# Patient Record
Sex: Female | Born: 1960 | Race: White | Hispanic: No | Smoking: Former smoker
Health system: Southern US, Community
[De-identification: ages and names within clinical notes are randomized; demographics above are authoritative.]

## PROBLEM LIST (undated history)

## (undated) DIAGNOSIS — M5410 Radiculopathy, site unspecified: Secondary | ICD-10-CM

## (undated) DIAGNOSIS — S83249A Other tear of medial meniscus, current injury, unspecified knee, initial encounter: Secondary | ICD-10-CM

## (undated) DIAGNOSIS — M47812 Spondylosis without myelopathy or radiculopathy, cervical region: Secondary | ICD-10-CM

## (undated) DIAGNOSIS — E785 Hyperlipidemia, unspecified: Secondary | ICD-10-CM

## (undated) DIAGNOSIS — E041 Nontoxic single thyroid nodule: Secondary | ICD-10-CM

## (undated) DIAGNOSIS — I2510 Atherosclerotic heart disease of native coronary artery without angina pectoris: Secondary | ICD-10-CM

## (undated) DIAGNOSIS — I10 Essential (primary) hypertension: Secondary | ICD-10-CM

## (undated) DIAGNOSIS — G5600 Carpal tunnel syndrome, unspecified upper limb: Secondary | ICD-10-CM

## (undated) HISTORY — DX: Other tear of medial meniscus, current injury, unspecified knee, initial encounter: S83.249A

## (undated) HISTORY — DX: Hyperlipidemia, unspecified: E78.5

## (undated) HISTORY — DX: Essential (primary) hypertension: I10

## (undated) HISTORY — DX: Radiculopathy, site unspecified: M54.10

## (undated) HISTORY — DX: Spondylosis without myelopathy or radiculopathy, cervical region: M47.812

## (undated) HISTORY — DX: Nontoxic single thyroid nodule: E04.1

## (undated) HISTORY — DX: Carpal tunnel syndrome, unspecified upper limb: G56.00

---
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
## 1996-06-19 HISTORY — PX: ABDOMINAL HYSTERECTOMY: SUR658

## 2010-10-09 ENCOUNTER — Other Ambulatory Visit: Payer: Self-pay | Admitting: Family Medicine

## 2010-10-09 DIAGNOSIS — Z1231 Encounter for screening mammogram for malignant neoplasm of breast: Secondary | ICD-10-CM

## 2010-10-20 ENCOUNTER — Ambulatory Visit
Admission: RE | Admit: 2010-10-20 | Discharge: 2010-10-20 | Disposition: A | Discharge status: Home / Self Care | Payer: PRIVATE HEALTH INSURANCE | Source: Ambulatory Visit | Attending: Family Medicine | Admitting: Family Medicine

## 2010-10-20 DIAGNOSIS — Z1231 Encounter for screening mammogram for malignant neoplasm of breast: Secondary | ICD-10-CM

## 2011-01-20 HISTORY — PX: CARPAL TUNNEL RELEASE: SHX101

## 2013-02-10 DIAGNOSIS — I781 Nevus, non-neoplastic: Secondary | ICD-10-CM | POA: Insufficient documentation

## 2013-02-10 DIAGNOSIS — D229 Melanocytic nevi, unspecified: Secondary | ICD-10-CM | POA: Insufficient documentation

## 2016-01-20 HISTORY — PX: MENISCUS REPAIR: SHX5179

## 2016-03-02 DIAGNOSIS — Z9889 Other specified postprocedural states: Secondary | ICD-10-CM | POA: Insufficient documentation

## 2017-01-07 DIAGNOSIS — S4992XA Unspecified injury of left shoulder and upper arm, initial encounter: Secondary | ICD-10-CM | POA: Insufficient documentation

## 2017-01-19 HISTORY — PX: ROTATOR CUFF REPAIR: SHX139

## 2017-03-15 DIAGNOSIS — M47812 Spondylosis without myelopathy or radiculopathy, cervical region: Secondary | ICD-10-CM | POA: Insufficient documentation

## 2017-04-19 HISTORY — PX: ANTERIOR CERVICAL DECOMP/DISCECTOMY FUSION: SHX1161

## 2017-04-21 DIAGNOSIS — G959 Disease of spinal cord, unspecified: Secondary | ICD-10-CM | POA: Insufficient documentation

## 2017-04-21 DIAGNOSIS — M4802 Spinal stenosis, cervical region: Secondary | ICD-10-CM | POA: Insufficient documentation

## 2017-04-21 DIAGNOSIS — M542 Cervicalgia: Secondary | ICD-10-CM | POA: Insufficient documentation

## 2017-05-18 DIAGNOSIS — Z9889 Other specified postprocedural states: Secondary | ICD-10-CM | POA: Insufficient documentation

## 2017-09-08 DIAGNOSIS — S43432A Superior glenoid labrum lesion of left shoulder, initial encounter: Secondary | ICD-10-CM | POA: Insufficient documentation

## 2017-12-03 DIAGNOSIS — M75102 Unspecified rotator cuff tear or rupture of left shoulder, not specified as traumatic: Secondary | ICD-10-CM | POA: Insufficient documentation

## 2019-06-20 HISTORY — PX: OTHER SURGICAL HISTORY: SHX169

## 2019-07-20 HISTORY — PX: CERVICAL ABLATION: SHX5771

## 2019-08-23 ENCOUNTER — Other Ambulatory Visit: Payer: Self-pay | Admitting: Family Medicine

## 2019-08-23 DIAGNOSIS — I739 Peripheral vascular disease, unspecified: Secondary | ICD-10-CM

## 2019-08-29 ENCOUNTER — Other Ambulatory Visit: Payer: PRIVATE HEALTH INSURANCE

## 2019-09-05 ENCOUNTER — Ambulatory Visit
Admission: RE | Admit: 2019-09-05 | Discharge: 2019-09-05 | Disposition: A | Discharge status: Home / Self Care | Payer: PRIVATE HEALTH INSURANCE | Source: Ambulatory Visit | Attending: Family Medicine | Admitting: Family Medicine

## 2019-09-05 ENCOUNTER — Other Ambulatory Visit: Payer: Self-pay

## 2019-09-05 DIAGNOSIS — I739 Peripheral vascular disease, unspecified: Secondary | ICD-10-CM

## 2019-09-12 ENCOUNTER — Other Ambulatory Visit: Payer: Self-pay | Admitting: Family Medicine

## 2019-09-12 DIAGNOSIS — E0789 Other specified disorders of thyroid: Secondary | ICD-10-CM

## 2019-09-18 ENCOUNTER — Ambulatory Visit
Admission: RE | Admit: 2019-09-18 | Discharge: 2019-09-18 | Disposition: A | Discharge status: Home / Self Care | Payer: PRIVATE HEALTH INSURANCE | Source: Ambulatory Visit | Attending: Family Medicine | Admitting: Family Medicine

## 2019-09-18 DIAGNOSIS — E0789 Other specified disorders of thyroid: Secondary | ICD-10-CM

## 2019-12-08 ENCOUNTER — Encounter: Payer: Self-pay | Admitting: *Deleted

## 2019-12-08 ENCOUNTER — Other Ambulatory Visit: Payer: Self-pay | Admitting: *Deleted

## 2019-12-11 ENCOUNTER — Encounter: Payer: Self-pay | Admitting: Diagnostic Neuroimaging

## 2019-12-11 ENCOUNTER — Ambulatory Visit: Payer: PRIVATE HEALTH INSURANCE | Admitting: Diagnostic Neuroimaging

## 2019-12-11 ENCOUNTER — Telehealth: Payer: Self-pay | Admitting: Diagnostic Neuroimaging

## 2019-12-11 ENCOUNTER — Other Ambulatory Visit: Payer: Self-pay

## 2019-12-11 VITALS — BP 109/73 | HR 80 | Ht 63.0 in | Wt 178.0 lb

## 2019-12-11 DIAGNOSIS — H538 Other visual disturbances: Secondary | ICD-10-CM

## 2019-12-11 DIAGNOSIS — R519 Headache, unspecified: Secondary | ICD-10-CM

## 2019-12-11 DIAGNOSIS — G44099 Other trigeminal autonomic cephalgias (TAC), not intractable: Secondary | ICD-10-CM

## 2019-12-11 MED ORDER — TOPIRAMATE 100 MG PO TABS
100.0000 mg | ORAL_TABLET | Freq: Two times a day (BID) | ORAL | 4 refills | Status: DC
Start: 2019-12-11 — End: 2020-09-10

## 2019-12-11 NOTE — Progress Notes (Signed)
[facility]  PATIENT: Renee Scott DOB: 08/21/1960  REFERRING CLINICIAN: Fanny Bien, MD HISTORY FROM: patient  REASON FOR VISIT: new consult    HISTORICAL  CHIEF COMPLAINT:  Chief Complaint  Patient presents with  . Tingling in hands, feet    rm 7 New Pt "HA behind L eye, causes blurred vision; neck discomfort, hands tingle off anf on"    HISTORY OF PRESENT ILLNESS:   59 year old female here for evaluation of headaches.  Patient has complex surgical history including C4-C6 ACDF in 2019, cervical nerve root ablation in July 2021, history of carpal tunnel release surgery, right knee meniscus tear surgery, rotator cuff repair.   Following cervical ablation surgery in July 2021 patient experienced increasing pain in the left posterior neck, left occipital region.  In September 2021 patient had increasing left-sided headaches, left retro-orbital pain, photophobia, blurred vision, scalp sensitivity.  Patient went to ophthalmology clinic was evaluated for temporal arteritis or other primary ocular pathologies which were ruled out.  No prior history of migraine.  Has family history of migraine in a cousin.  Headaches last 20 to 25 minutes at a time, multiple times a day.  She takes Aleve and Motrin multiple times on a daily basis.  No other specific triggering or aggravating factors.   REVIEW OF SYSTEMS: Full 14 system review of systems performed and negative with exception of: As per HPI.  ALLERGIES: Allergies  Allergen Reactions  . Ketamine Other (See Comments)    "I hallucinated terribly"    HOME MEDICATIONS: Outpatient Medications Prior to Visit  Medication Sig Dispense Refill  . Cholecalciferol (VITAMIN D3 PO) Take 5,000 Units by mouth daily.    Marland Kitchen ibuprofen (ADVIL) 800 MG tablet Take 800 mg by mouth every 8 (eight) hours as needed.    . rosuvastatin (CRESTOR) 5 MG tablet Take 1 tablet by mouth every night for high cholesterol    .  topiramate (TOPAMAX) 50 MG tablet Take 50 mg by mouth 3 (three) times daily.    . valsartan-hydrochlorothiazide (DIOVAN-HCT) 80-12.5 MG tablet Take 1 tablet by mouth daily.    . calcium carbonate (TUMS EX) 750 MG chewable tablet Chew by mouth. (Patient not taking: Reported on 12/11/2019)    . gabapentin (NEURONTIN) 100 MG capsule Take by mouth. (Patient not taking: Reported on 12/11/2019)    . lidocaine (XYLOCAINE) 5 % ointment Apply topically 3 (three) times daily as needed. (Patient not taking: Reported on 12/11/2019)     No facility-administered medications prior to visit.    PAST MEDICAL HISTORY: Past Medical History:  Diagnosis Date  . Carpal tunnel syndrome   . Cervical arthritis   . Hyperlipidemia   . Hypertension   . Medial meniscus tear    right  . Radiculopathy   . Spondylosis of cervical spine   . Thyroid nodule     PAST SURGICAL HISTORY: Past Surgical History:  Procedure Laterality Date  . ABDOMINAL HYSTERECTOMY  06/1996  . ANTERIOR CERVICAL DECOMP/DISCECTOMY FUSION  04/2017  . CARPAL TUNNEL RELEASE Right 2013  . CERVICAL ABLATION Left 07/2019  . left neck cervical ablation  06/2019  . MENISCUS REPAIR Right 2018  . ROTATOR CUFF REPAIR Left 2019    FAMILY HISTORY: Family History  Problem Relation Age of Onset  . Hypertension Father   . High Cholesterol Father   . Hypertension Paternal Grandmother   . High Cholesterol Paternal Grandmother   . Thyroid disease Paternal Grandmother   . Diabetes Paternal Grandmother  SOCIAL HISTORY: Social History   Socioeconomic History  . Marital status: Widowed    Spouse name: Not on file  . Number of children: 3  . Years of education: Not on file  . Highest education level: Associate degree: academic program  Occupational History    Comment: RN  Tobacco Use  . Smoking status: Former Smoker    Quit date: 06/09/2016    Years since quitting: 3.5  . Smokeless tobacco: Never Used  Substance and Sexual Activity  .  Alcohol use: Not Currently  . Drug use: Never  . Sexual activity: Not on file  Other Topics Concern  . Not on file  Social History Narrative   Lives alone   Caffeine- 3-5 c daily   Social Determinants of Health   Financial Resource Strain:   . Difficulty of Paying Living Expenses: Not on file  Food Insecurity:   . Worried About Charity fundraiser in the Last Year: Not on file  . Ran Out of Food in the Last Year: Not on file  Transportation Needs:   . Lack of Transportation (Medical): Not on file  . Lack of Transportation (Non-Medical): Not on file  Physical Activity:   . Days of Exercise per Week: Not on file  . Minutes of Exercise per Session: Not on file  Stress:   . Feeling of Stress : Not on file  Social Connections:   . Frequency of Communication with Friends and Family: Not on file  . Frequency of Social Gatherings with Friends and Family: Not on file  . Attends Religious Services: Not on file  . Active Member of Clubs or Organizations: Not on file  . Attends Archivist Meetings: Not on file  . Marital Status: Not on file  Intimate Partner Violence:   . Fear of Current or Ex-Partner: Not on file  . Emotionally Abused: Not on file  . Physically Abused: Not on file  . Sexually Abused: Not on file     PHYSICAL EXAM  GENERAL EXAM/CONSTITUTIONAL: Vitals:  Vitals:   12/11/19 0944  BP: 109/73  Pulse: 80  Weight: 178 lb (80.7 kg)  Height: '5\' 3"'  (1.6 m)     Body mass index is 31.53 kg/m. Wt Readings from Last 3 Encounters:  12/11/19 178 lb (80.7 kg)     Patient is in no distress; well developed, nourished and groomed; neck is supple  CARDIOVASCULAR:  Examination of carotid arteries is normal; no carotid bruits  Regular rate and rhythm, no murmurs  Examination of peripheral vascular system by observation and palpation is normal  EYES:  Ophthalmoscopic exam of optic discs and posterior segments is normal; no papilledema or hemorrhages  No  exam data present  MUSCULOSKELETAL:  Gait, strength, tone, movements noted in Neurologic exam below  NEUROLOGIC: MENTAL STATUS:  No flowsheet data found.  awake, alert, oriented to person, place and time  recent and remote memory intact  normal attention and concentration  language fluent, comprehension intact, naming intact  fund of knowledge appropriate  CRANIAL NERVE:   2nd - no papilledema on fundoscopic exam  2nd, 3rd, 4th, 6th - pupils equal and reactive to light, visual fields full to confrontation, extraocular muscles intact, no nystagmus  5th - facial sensation symmetric  7th - facial strength symmetric  8th - hearing intact  9th - palate elevates symmetrically, uvula midline  11th - shoulder shrug symmetric  12th - tongue protrusion midline  MOTOR:   normal bulk and tone, full  strength in the BUE, BLE  SENSORY:   normal and symmetric to light touch, temperature, vibration  COORDINATION:   finger-nose-finger, fine finger movements normal  REFLEXES:   deep tendon reflexes present and symmetric  GAIT/STATION:   narrow based gait     DIAGNOSTIC DATA (LABS, IMAGING, TESTING) - I reviewed patient records, labs, notes, testing and imaging myself where available.  No results found for: WBC, HGB, HCT, MCV, PLT No results found for: NA, K, CL, CO2, GLUCOSE, BUN, CREATININE, CALCIUM, PROT, ALBUMIN, AST, ALT, ALKPHOS, BILITOT, GFRNONAA, GFRAA No results found for: CHOL, HDL, LDLCALC, LDLDIRECT, TRIG, CHOLHDL No results found for: HGBA1C No results found for: VITAMINB12 No results found for: TSH   09/05/19 carotid u/s: - Minor carotid atherosclerosis. No hemodynamically significant ICA stenosis. Degree of narrowing less than 50% bilaterally by ultrasound criteria. - Patent antegrade vertebral flow bilaterally - Calcified right thyroid nodules.  See above recommendation.  04/09/17 MRI cervical spine - Multilevel degenerative changes of the  cervical spine most advanced at C4-C5 where there is near circumferential effacement of CSF, mass effect on the ventral spinal cord and severe bilateral foraminal stenosis. There is also bilateral foraminal stenosis at C5-C6 severe on the right and moderate on the left at C5-C6 and moderate bilateral foraminal stenosis at C6-C7.  12/07/19 ESR 20, CRP 8.3    ASSESSMENT AND PLAN  59 y.o. 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  Cluster headache or migraine are in differential.  Other secondary causes also possible.  We will proceed with further work-up and try additional headache treatments.   Dx:  1. Trigeminal autonomic cephalgias      Meds tried: gabapentin, topiramate   PLAN:  LEFT SIDED HEADACHES (? TRIGEMINAL AUTONOMIC CEPHALGIA --> SUNCT vs cluster headache vs migraine) - check MRI brain (rule out secondary causes)  HEADACHE PREVENTION  LIFESTYLE CHANGES -Stop or avoid smoking -Decrease or avoid caffeine / alcohol -Eat and sleep on a regular schedule -Exercise several times per week - increase topiramate to 1106m twice a day  - may consider trileptal or lamotrigine in future - may consider galazanezumab (Emgality) 243mloading dose; then 12073monthly in future  HEADACHE RESCUE - ibuprofen, tylenol as needed; limit to 5-10 times per month - consider rizatriptan (Maxalt) 25m43m needed for breakthrough headache; may repeat x 1 after 2 hours; max 2 tabs per day or 8 per month  Orders Placed This Encounter  Procedures  . MR BRAIN W WO CONTRAST   Meds ordered this encounter  Medications  . topiramate (TOPAMAX) 100 MG tablet    Sig: Take 1 tablet (100 mg total) by mouth 2 (two) times daily.    Dispense:  180 tablet    Refill:  4   Return in about 6 months (around 06/09/2020) for with NP (Amy Lomax).    VIKRPenni Bombard  11/243/[id number] in Neurology, Neurophysiology and Neuroimaging  [facility] [address] [phone]

## 2019-12-11 NOTE — Telephone Encounter (Signed)
[employer] order sent to GI. They will obtain the auth and reach out to the patient to schedule.  

## 2019-12-11 NOTE — Patient Instructions (Signed)
  LEFT SIDED HEADACHES (? TRIGEMINAL AUTONOMIC CEPHALGIA --> SUNCT vs cluster headache vs migraine) - check MRI brain (rule out secondary causes)  MIGRAINE PREVENTION  LIFESTYLE CHANGES -Stop or avoid smoking -Decrease or avoid caffeine / alcohol -Eat and sleep on a regular schedule -Exercise several times per week - increase topiramate to 100mg  twice a day  - may consider trileptal or lamotrigine in future - may consider galazanezumab (Emgality) 240mg  loading dose; then 120mg  monthly in future  MIGRAINE RESCUE - ibuprofen, tylenol as needed; limit to 5-10 times per month - consider rizatriptan (Maxalt) 10mg  as needed for breakthrough headache; may repeat x 1 after 2 hours; max 2 tabs per day or 8 per month

## 2019-12-12 ENCOUNTER — Other Ambulatory Visit: Payer: Self-pay | Admitting: Diagnostic Neuroimaging

## 2019-12-22 ENCOUNTER — Other Ambulatory Visit: Payer: Self-pay | Admitting: Family Medicine

## 2019-12-22 ENCOUNTER — Other Ambulatory Visit: Payer: Self-pay

## 2019-12-22 ENCOUNTER — Ambulatory Visit
Admission: RE | Admit: 2019-12-22 | Discharge: 2019-12-22 | Disposition: A | Discharge status: Home / Self Care | Payer: PRIVATE HEALTH INSURANCE | Source: Ambulatory Visit | Attending: Diagnostic Neuroimaging | Admitting: Diagnostic Neuroimaging

## 2019-12-22 DIAGNOSIS — H538 Other visual disturbances: Secondary | ICD-10-CM

## 2019-12-22 DIAGNOSIS — Z1231 Encounter for screening mammogram for malignant neoplasm of breast: Secondary | ICD-10-CM

## 2019-12-22 DIAGNOSIS — R519 Headache, unspecified: Secondary | ICD-10-CM

## 2019-12-22 MED ORDER — GADOBENATE DIMEGLUMINE 529 MG/ML IV SOLN
15.0000 mL | Freq: Once | INTRAVENOUS | Status: AC | PRN
  Administered 2019-12-22: 15 mL via INTRAVENOUS

## 2020-01-22 ENCOUNTER — Encounter: Payer: Self-pay | Admitting: *Deleted

## 2020-02-02 ENCOUNTER — Ambulatory Visit: Payer: PRIVATE HEALTH INSURANCE

## 2020-03-12 ENCOUNTER — Ambulatory Visit: Payer: PRIVATE HEALTH INSURANCE

## 2020-03-13 ENCOUNTER — Ambulatory Visit: Payer: PRIVATE HEALTH INSURANCE

## 2020-04-29 ENCOUNTER — Ambulatory Visit: Payer: PRIVATE HEALTH INSURANCE

## 2020-06-10 NOTE — Progress Notes (Signed)
Chief Complaint  Patient presents with  . 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      HISTORY OF PRESENT ILLNESS: 06/11/20 ALL:  Renee Scott is a 60 y.o. female here today for follow up for left sided headaches. MRI was unremarkable. Topiramate was increased to 100mg  BID. Since, headaches are less frequent. She reports that headaches are fairly severe when she does get them. She has left sided pounding, throbbing headaches. Occurring daily. Can last minutes to hours. She has light and sound sensitivity. She describes having wavy vision in left eye prior to headache. She has tried Aleve and Motrin with minimal relief. She has cut back and no longer taking daily. She does not smoke.    HISTORY (copied from Dr 58 previous note)  60 year old female here for evaluation of headaches.  Patient has complex surgical history including C4-C6 ACDF in 2019, cervical nerve root ablation in July 2021, history of carpal tunnel release surgery, right knee meniscus tear surgery, rotator cuff repair.   Following cervical ablation surgery in July 2021 patient experienced increasing pain in the left posterior neck, left occipital region.  In September 2021 patient had increasing left-sided headaches, left retro-orbital pain, photophobia, blurred vision, scalp sensitivity.  Patient went to ophthalmology clinic was evaluated for temporal arteritis or other primary ocular pathologies which were ruled out.  No prior history of migraine.  Has family history of migraine in a cousin.  Headaches last 20 to 25 minutes at a time, multiple times a day.  She takes Aleve and Motrin multiple times on a daily basis.  No other specific triggering or aggravating factors.    REVIEW OF SYSTEMS: Out of a complete 14 system review of symptoms, the  patient complains only of the following symptoms, headaches and all other reviewed systems are negative.    ALLERGIES: Allergies  Allergen Reactions  . Ketamine Other (See Comments)    "I hallucinated terribly"     HOME MEDICATIONS: Outpatient Medications Prior to Visit  Medication Sig Dispense Refill  . calcium carbonate (TUMS EX) 750 MG chewable tablet Chew by mouth.    . Cholecalciferol (VITAMIN D3 PO) Take 5,000 Units by mouth daily.    August 2021 ibuprofen (ADVIL) 800 MG tablet Take 800 mg by mouth every 8 (eight) hours as needed.    . rosuvastatin (CRESTOR) 10 MG tablet Take 10 mg by mouth at bedtime.    . topiramate (TOPAMAX) 100 MG tablet Take 1 tablet (100 mg total) by mouth 2 (two) times daily. 180 tablet 4  . valsartan-hydrochlorothiazide (DIOVAN-HCT) 160-25 MG tablet Take 1 tablet by mouth daily.    October 2021 gabapentin (NEURONTIN) 100 MG capsule Take by mouth. (Patient not taking: Reported on 12/11/2019)    . lidocaine (XYLOCAINE) 5 % ointment Apply topically 3 (three) times daily as needed. (Patient not taking: Reported on 12/11/2019)    . rosuvastatin (CRESTOR) 5 MG tablet Take 1 tablet by mouth every night for high cholesterol     No facility-administered medications prior to visit.     PAST MEDICAL HISTORY: Past Medical History:  Diagnosis Date  . Carpal tunnel syndrome   . Cervical arthritis   . Hyperlipidemia   . Hypertension   . Medial meniscus tear    right  . Radiculopathy   . Spondylosis  of cervical spine   . Thyroid nodule      PAST SURGICAL HISTORY: Past Surgical History:  Procedure Laterality Date  . ABDOMINAL HYSTERECTOMY  06/1996  . ANTERIOR CERVICAL DECOMP/DISCECTOMY FUSION  04/2017  . CARPAL TUNNEL RELEASE Right 2013  . CERVICAL ABLATION Left 07/2019  . left neck cervical ablation  06/2019  . MENISCUS REPAIR Right 2018  . ROTATOR CUFF REPAIR Left 2019     FAMILY HISTORY: Family History  Problem Relation Age of Onset  . Hypertension Father   .  High Cholesterol Father   . Hypertension Paternal Grandmother   . High Cholesterol Paternal Grandmother   . Thyroid disease Paternal Grandmother   . Diabetes Paternal Grandmother      SOCIAL HISTORY: Social History   Socioeconomic History  . Marital status: Widowed    Spouse name: Not on file  . Number of children: 3  . Years of education: Not on file  . Highest education level: Associate degree: academic program  Occupational History    Comment: RN  Tobacco Use  . Smoking status: Former Smoker    Quit date: 06/09/2016    Years since quitting: 4.0  . Smokeless tobacco: Never Used  Substance and Sexual Activity  . Alcohol use: Not Currently  . Drug use: Never  . Sexual activity: Not on file  Other Topics Concern  . Not on file  Social History Narrative   Lives alone   Caffeine- 3-5 c daily   Social Determinants of Health   Financial Resource Strain: Not on file  Food Insecurity: Not on file  Transportation Needs: Not on file  Physical Activity: Not on file  Stress: Not on file  Social Connections: Not on file  Intimate Partner Violence: Not on file      PHYSICAL EXAM  Vitals:   06/11/20 0944  BP: 92/63  Pulse: (!) 109  Weight: 170 lb (77.1 kg)  Height: 5\' 3"  (1.6 m)   Body mass index is 30.11 kg/m.   Generalized: Well developed, in no acute distress  Cardiology: normal rate and rhythm, no murmur auscultated  Respiratory: clear to auscultation bilaterally    Neurological examination  Mentation: Alert oriented to time, place, history taking. Follows all commands speech and language fluent Cranial nerve II-XII: Pupils were equal round reactive to light. Extraocular movements were full, visual field were full on confrontational test. Facial sensation and strength were normal. Uvula tongue midline. Head turning and shoulder shrug  were normal and symmetric. Motor: The motor testing reveals 5 over 5 strength of all 4 extremities. Good symmetric motor tone  is noted throughout.  Sensory: Sensory testing is intact to soft touch on all 4 extremities. No evidence of extinction is noted.  Coordination: Cerebellar testing reveals good finger-nose-finger and heel-to-shin bilaterally.  Gait and station: Gait is normal. Tandem gait is normal. Romberg is negative. No drift is seen.  Reflexes: Deep tendon reflexes are symmetric and normal bilaterally.     DIAGNOSTIC DATA (LABS, IMAGING, TESTING) - I reviewed patient records, labs, notes, testing and imaging myself where available.  No results found for: WBC, HGB, HCT, MCV, PLT No results found for: NA, K, CL, CO2, GLUCOSE, BUN, CREATININE, CALCIUM, PROT, ALBUMIN, AST, ALT, ALKPHOS, BILITOT, GFRNONAA, GFRAA No results found for: CHOL, HDL, LDLCALC, LDLDIRECT, TRIG, CHOLHDL No results found for: No results found for: VITAMINB12 No results found for: TSH  No flowsheet data found.   No flowsheet data found.   ASSESSMENT AND PLAN  60 y.o. year old female  has a past medical history of Carpal tunnel syndrome, Cervical arthritis, Hyperlipidemia, Hypertension, Medial meniscus tear, Radiculopathy, Spondylosis of cervical spine, and Thyroid nodule. here with    Chronic migraine with aura - Plan: Galcanezumab-gnlm (EMGALITY) 120 MG/ML SOAJ, rizatriptan (MAXALT-MLT) 10 MG disintegrating tablet  We will continue topiramate 100mg  twice daily. I will add Emgality. She will administer 2 injections for starter dose then continue 1 injection every 30 days. We will start rizatriptan as needed for abortive therapy. Correct administration, side effects and storage of each were discussed. She will continue healthy lifestyle habits. Continue follow up with spine specialist and PCP. Follow up with me in 3 months.    No orders of the defined types were placed in this encounter.    Meds ordered this encounter  Medications  . Galcanezumab-gnlm (EMGALITY) 120 MG/ML SOAJ    Sig: Inject 120 mg into the skin  every 30 (thirty) days.    Dispense:  3 mL    Refill:  3    Order Specific Question:   Supervising Provider    Answer:   Anson Fret  . rizatriptan (MAXALT-MLT) 10 MG disintegrating tablet    Sig: Take 1 tablet (10 mg total) by mouth as needed for migraine. May repeat in 2 hours if needed    Dispense:  9 tablet    Refill:  11    Order Specific Question:   Supervising Provider    Answer:   [id number] Anson Fret  . Galcanezumab-gnlm (EMGALITY) 120 MG/ML SOAJ    Sig: Administer 2 injections for starter dose, continue 1 injection every 30 days following    Dispense:  2 mL    Refill:  0    Order Specific Question:   Supervising Provider    Answer:   [id number] Anson Fret, MSN, FNP-C 06/11/2020, 10:24 AM  [facility] [address] [place], [place] [phone]

## 2020-06-11 ENCOUNTER — Ambulatory Visit: Payer: PRIVATE HEALTH INSURANCE | Admitting: Family Medicine

## 2020-06-11 ENCOUNTER — Encounter: Payer: Self-pay | Admitting: Family Medicine

## 2020-06-11 VITALS — BP 92/63 | HR 109 | Ht 63.0 in | Wt 170.0 lb

## 2020-06-11 DIAGNOSIS — G43109 Migraine with aura, not intractable, without status migrainosus: Secondary | ICD-10-CM

## 2020-06-11 DIAGNOSIS — R519 Headache, unspecified: Secondary | ICD-10-CM

## 2020-06-11 MED ORDER — EMGALITY 120 MG/ML ~~LOC~~ SOAJ: SUBCUTANEOUS | 0 refills | Status: DC

## 2020-06-11 MED ORDER — RIZATRIPTAN BENZOATE 10 MG PO TBDP: 10.0000 mg | ORAL_TABLET | ORAL | 11 refills | Status: DC | PRN

## 2020-06-11 MED ORDER — EMGALITY 120 MG/ML ~~LOC~~ SOAJ: 120.0000 mg | SUBCUTANEOUS | 3 refills | Status: DC

## 2020-06-11 NOTE — Patient Instructions (Addendum)
Below is our plan:  We will continue topiramate  twice daily. I will add Emgality injections every 30 days. You will administer two injections for the first dose and then continue 1 injection every 30 days following. I will also start rizatriptan for abortive therapy. Please take 1 tablet at onset of headache. May take 1 additional tablet in 2 hours if needed. Do not take more than 2 tablets in 24 hours or more than 10 in a month.    Please make sure you are staying well hydrated. I recommend 50-60 ounces daily. Well balanced diet and regular exercise encouraged. Consistent sleep schedule with 6-8 hours recommended.   Please continue follow up with care team as directed.   Follow up with me in 3 months   You may receive a survey regarding today's visit. I encourage you to leave honest feed back as I do use this information to improve patient care. Thank you for seeing me today!    Galcanezumab injection What is this medicine? GALCANEZUMAB (gal ka NEZ ue mab) is used to prevent migraines and treat cluster headaches. This medicine may be used for other purposes; ask your health care provider or pharmacist if you have questions. COMMON BRAND NAME(S): Emgality What should I tell my health care provider before I take this medicine? They need to know if you have any of these conditions:  an unusual or allergic reaction to galcanezumab, other medicines, foods, dyes, or preservatives  pregnant or trying to get pregnant  breast-feeding How should I use this medicine? This medicine is for injection under the skin. You will be taught how to prepare and give this medicine. Use exactly as directed. Take your medicine at regular intervals. Do not take your medicine more often than directed. It is important that you put your used needles and syringes in a special sharps container. Do not put them in a trash can. If you do not have a sharps container, call your pharmacist or healthcare provider  to get one. Talk to your pediatrician regarding the use of this medicine in children. Special care may be needed. Overdosage: If you think you have taken too much of this medicine contact a poison control center or emergency room at once. NOTE: This medicine is only for you. Do not share this medicine with others. What if I miss a dose? If you miss a dose, take it as soon as you can. If it is almost time for your next dose, take only that dose. Do not take double or extra doses. What may interact with this medicine? Interactions are not expected. This list may not describe all possible interactions. Give your health care provider a list of all the medicines, herbs, non-prescription drugs, or dietary supplements you use. Also tell them if you smoke, drink alcohol, or use illegal drugs. Some items may interact with your medicine. What should I watch for while using this medicine? Tell your doctor or healthcare professional if your symptoms do not start to get better or if they get worse. What side effects may I notice from receiving this medicine? Side effects that you should report to your doctor or health care professional as soon as possible:  allergic reactions like skin rash, itching or hives, swelling of the face, lips, or tongue Side effects that usually do not require medical attention (report these to your doctor or health care professional if they continue or are bothersome):  pain, redness, or irritation at site where injected This list may  not describe all possible side effects. Call your doctor for medical advice about side effects. You may report side effects to FDA at 1-800-FDA-1088. Where should I keep my medicine? Keep out of the reach of children. You will be instructed on how to store this medicine. Throw away any unused medicine after the expiration date on the label. NOTE: This sheet is a summary. It may not cover all possible information. If you have questions about this  medicine, talk to your doctor, pharmacist, or health care provider.  2021 Elsevier/Gold Standard (2017-06-23 12:03:23)   Rizatriptan disintegrating tablets What is this medicine? RIZATRIPTAN (rye za TRIP tan) is used to treat migraines with or without aura. An aura is a strange feeling or visual disturbance that warns you of an attack. It is not used to prevent migraines. This medicine may be used for other purposes; ask your health care provider or pharmacist if you have questions. COMMON BRAND NAME(S): Maxalt-MLT What should I tell my health care provider before I take this medicine? They need to know if you have any of these conditions:  cigarette smoker  circulation problems in fingers and toes  diabetes  heart disease  high blood pressure  high cholesterol  history of irregular heartbeat  history of stroke  kidney disease  liver disease  stomach or intestine problems  an unusual or allergic reaction to rizatriptan, other medicines, foods, dyes, or preservatives  pregnant or trying to get pregnant  breast-feeding How should I use this medicine? Take this medicine by mouth. Follow the directions on the prescription label. Leave the tablet in the sealed blister pack until you are ready to take it. With dry hands, open the blister and gently remove the tablet. If the tablet breaks or crumbles, throw it away and take a new tablet out of the blister pack. Place the tablet in the mouth and allow it to dissolve, and then swallow. Do not cut, crush, or chew this medicine. You do not need water to take this medicine. Do not take it more often than directed. Talk to your pediatrician regarding the use of this medicine in children. While this drug may be prescribed for children as young as 6 years for selected conditions, precautions do apply. Overdosage: If you think you have taken too much of this medicine contact a poison control center or emergency room at once. NOTE: This  medicine is only for you. Do not share this medicine with others. What if I miss a dose? This does not apply. This medicine is not for regular use. What may interact with this medicine? Do not take this medicine with any of the following medicines:  certain medicines for migraine headache like almotriptan, eletriptan, frovatriptan, naratriptan, rizatriptan, sumatriptan, zolmitriptan  ergot alkaloids like dihydroergotamine, ergonovine, ergotamine, methylergonovine  MAOIs like Carbex, Eldepryl, Marplan, Nardil, and Parnate This medicine may also interact with the following medications:  certain medicines for depression, anxiety, or psychotic disorders  propranolol This list may not describe all possible interactions. Give your health care provider a list of all the medicines, herbs, non-prescription drugs, or dietary supplements you use. Also tell them if you smoke, drink alcohol, or use illegal drugs. Some items may interact with your medicine. What should I watch for while using this medicine? Visit your healthcare professional for regular checks on your progress. Tell your healthcare professional if your symptoms do not start to get better or if they get worse. You may get drowsy or dizzy. Do not drive, use  machinery, or do anything that needs mental alertness until you know how this medicine affects you. Do not stand up or sit up quickly, especially if you are an older patient. This reduces the risk of dizzy or fainting spells. Alcohol may interfere with the effect of this medicine. Your mouth may get dry. Chewing sugarless gum or sucking hard candy and drinking plenty of water may help. Contact your healthcare professional if the problem does not go away or is severe. If you take migraine medicines for 10 or more days a month, your migraines may get worse. Keep a diary of headache days and medicine use. Contact your healthcare professional if your migraine attacks occur more frequently. What  side effects may I notice from receiving this medicine? Side effects that you should report to your doctor or health care professional as soon as possible:  allergic reactions like skin rash, itching or hives, swelling of the face, lips, or tongue  chest pain or chest tightness  signs and symptoms of a dangerous change in heartbeat or heart rhythm like chest pain; dizziness; fast, irregular heartbeat; palpitations; feeling faint or lightheaded; falls; breathing problems  signs and symptoms of a stroke like changes in vision; confusion; trouble speaking or understanding; severe headaches; sudden numbness or weakness of the face, arm or leg; trouble walking; dizziness; loss of balance or coordination  signs and symptoms of serotonin syndrome like irritable; confusion; diarrhea; fast or irregular heartbeat; muscle twitching; stiff muscles; trouble walking; sweating; high fever; seizures; chills; vomiting Side effects that usually do not require medical attention (report to your doctor or health care professional if they continue or are bothersome):  diarrhea  dizziness  drowsiness  dry mouth  headache  nausea, vomiting  pain, tingling, numbness in the hands or feet  stomach pain This list may not describe all possible side effects. Call your doctor for medical advice about side effects. You may report side effects to FDA at 1-800-FDA-1088. Where should I keep my medicine? Keep out of the reach of children. Store at room temperature between 15 and 30 degrees C (59 and 86 degrees F). Protect from light and moisture. Throw away any unused medicine after the expiration date. NOTE: This sheet is a summary. It may not cover all possible information. If you have questions about this medicine, talk to your doctor, pharmacist, or health care provider.  2021 Elsevier/Gold Standard (2017-07-20 14:58:08)    Migraine Headache A migraine headache is a very strong throbbing pain on one side or  both sides of your head. This type of headache can also cause other symptoms. It can last from 4 hours to 3 days. Talk with your doctor about what things may bring on (trigger) this condition. What are the causes? The exact cause of this condition is not known. This condition may be triggered or caused by:  Drinking alcohol.  Smoking.  Taking medicines, such as: ? Medicine used to treat chest pain (nitroglycerin). ? Birth control pills. ? Estrogen. ? Some blood pressure medicines.  Eating or drinking certain products.  Doing physical activity. Other things that may trigger a migraine headache include:  Having a menstrual period.  Pregnancy.  Hunger.  Stress.  Not getting enough sleep or getting too much sleep.  Weather changes.  Tiredness (fatigue). What increases the risk?  Being 67-51 years old.  Being female.  Having a family history of migraine headaches.  Being Caucasian.  Having depression or anxiety.  Being very overweight. What are the signs  or symptoms?  A throbbing pain. This pain may: ? Happen in any area of the head, such as on one side or both sides. ? Make it hard to do daily activities. ? Get worse with physical activity. ? Get worse around bright lights or loud noises.  Other symptoms may include: ? Feeling sick to your stomach (nauseous). ? Vomiting. ? Dizziness. ? Being sensitive to bright lights, loud noises, or smells.  Before you get a migraine headache, you may get warning signs (an aura). An aura may include: ? Seeing flashing lights or having blind spots. ? Seeing bright spots, halos, or zigzag lines. ? Having tunnel vision or blurred vision. ? Having numbness or a tingling feeling. ? Having trouble talking. ? Having weak muscles.  Some people have symptoms after a migraine headache (postdromal phase), such as: ? Tiredness. ? Trouble thinking (concentrating). How is this treated?  Taking medicines that: ? Relieve  pain. ? Relieve the feeling of being sick to your stomach. ? Prevent migraine headaches.  Treatment may also include: ? Having acupuncture. ? Avoiding foods that bring on migraine headaches. ? Learning ways to control your body functions (biofeedback). ? Therapy to help you know and deal with negative thoughts (cognitive behavioral therapy). Follow these instructions at home: Medicines  Take over-the-counter and prescription medicines only as told by your doctor.  Ask your doctor if the medicine prescribed to you: ? Requires you to avoid driving or using heavy machinery. ? Can cause trouble pooping (constipation). You may need to take these steps to prevent or treat trouble pooping:  Drink enough fluid to keep your pee (urine) pale yellow.  Take over-the-counter or prescription medicines.  Eat foods that are high in fiber. These include beans, whole grains, and fresh fruits and vegetables.  Limit foods that are high in fat and sugar. These include fried or sweet foods. Lifestyle  Do not drink alcohol.  Do not use any products that contain nicotine or tobacco, such as cigarettes, e-cigarettes, and chewing tobacco. If you need help quitting, ask your doctor.  Get at least 8 hours of sleep every night.  Limit and deal with stress. General instructions  Keep a journal to find out what may bring on your migraine headaches. For example, write down: ? What you eat and drink. ? How much sleep you get. ? Any change in what you eat or drink. ? Any change in your medicines.  If you have a migraine headache: ? Avoid things that make your symptoms worse, such as bright lights. ? It may help to lie down in a dark, quiet room. ? Do not drive or use heavy machinery. ? Ask your doctor what activities are safe for you.  Keep all follow-up visits as told by your doctor. This is important.      Contact a doctor if:  You get a migraine headache that is different or worse than others  you have had.  You have more than 15 headache days in one month. Get help right away if:  Your migraine headache gets very bad.  Your migraine headache lasts longer than 72 hours.  You have a fever.  You have a stiff neck.  You have trouble seeing.  Your muscles feel weak or like you cannot control them.  You start to lose your balance a lot.  You start to have trouble walking.  You pass out (faint).  You have a seizure. Summary  A migraine headache is a very strong throbbing  pain on one side or both sides of your head. These headaches can also cause other symptoms.  This condition may be treated with medicines and changes to your lifestyle.  Keep a journal to find out what may bring on your migraine headaches.  Contact a doctor if you get a migraine headache that is different or worse than others you have had.  Contact your doctor if you have more than 15 headache days in a month. This information is not intended to replace advice given to you by your health care provider. Make sure you discuss any questions you have with your health care provider. Document Revised: 04/29/2018 Document Reviewed: 02/17/2018 Elsevier Patient Education  2021 [employer].

## 2020-06-13 NOTE — Progress Notes (Signed)
I reviewed note and agree with plan.   Halimah Bewick R. Roderick Sweezy, MD 06/13/2020, 10:37 AM Certified in Neurology, Neurophysiology and Neuroimaging  [facility] [address], [address] [place], [place] [address] [phone]  

## 2020-06-21 ENCOUNTER — Ambulatory Visit
Admission: RE | Admit: 2020-06-21 | Discharge: 2020-06-21 | Disposition: A | Discharge status: Home / Self Care | Payer: PRIVATE HEALTH INSURANCE | Source: Ambulatory Visit | Attending: Family Medicine | Admitting: Family Medicine

## 2020-06-21 ENCOUNTER — Other Ambulatory Visit: Payer: Self-pay

## 2020-06-21 DIAGNOSIS — Z1231 Encounter for screening mammogram for malignant neoplasm of breast: Secondary | ICD-10-CM

## 2020-07-03 ENCOUNTER — Other Ambulatory Visit: Payer: Self-pay | Admitting: Family Medicine

## 2020-07-03 DIAGNOSIS — E041 Nontoxic single thyroid nodule: Secondary | ICD-10-CM

## 2020-07-12 ENCOUNTER — Ambulatory Visit
Admission: RE | Admit: 2020-07-12 | Discharge: 2020-07-12 | Disposition: A | Discharge status: Home / Self Care | Payer: PRIVATE HEALTH INSURANCE | Source: Ambulatory Visit | Attending: Family Medicine | Admitting: Family Medicine

## 2020-07-12 DIAGNOSIS — E041 Nontoxic single thyroid nodule: Secondary | ICD-10-CM

## 2020-09-09 NOTE — Progress Notes (Signed)
Chief Complaint  Patient presents with   Follow-up    Rm 1, alone. Here for migraine f/u, pt reports doing well. Pt repots taking 12/18 tablets of her rizatriptan. Emgality has helped, tends to wear off 3 days before her next dose. Has pressure back of her eye.     HISTORY OF PRESENT ILLNESS: 09/10/20 ALL:  Renee Scott returns for headache follow up. We continued topiramate 100mg  BID and added Emgality at last visit 05/2020. Rizatriptan started for abortive therapy. She reports significant improvement in migraines. She is tolerating meds well. She has used 12 rizatriptan tablets in the last three months. It works well.   06/11/2020 ALL:  Renee Scott is a 60 y.o. female here today for follow up for left sided headaches. MRI was unremarkable. Topiramate was increased to 100mg  BID. Since, headaches are less frequent. She reports that headaches are fairly severe when she does get them. She has left sided pounding, throbbing headaches. Occurring daily. Can last minutes to hours. She has light and sound sensitivity. She describes having wavy vision in left eye prior to headache. She has tried Aleve and Motrin with minimal relief. She has cut back and no longer taking daily. She does not smoke.   HISTORY (copied from Dr 41 previous note)  60 year old female here for evaluation of headaches.   Patient has complex surgical history including C4-C6 ACDF in 2019, cervical nerve root ablation in July 2021, history of carpal tunnel release surgery, right knee meniscus tear surgery, rotator cuff repair.    Following cervical ablation surgery in July 2021 patient experienced increasing pain in the left posterior neck, left occipital region.  In September 2021 patient had increasing left-sided headaches, left retro-orbital pain, photophobia, blurred vision, scalp sensitivity.  Patient went to ophthalmology clinic was evaluated for temporal arteritis or other primary ocular pathologies which were  ruled out.   No prior history of migraine.  Has family history of migraine in a cousin.  Headaches last 20 to 25 minutes at a time, multiple times a day.  She takes Aleve and Motrin multiple times on a daily basis.  No other specific triggering or aggravating factors.   REVIEW OF SYSTEMS: Out of a complete 14 system review of symptoms, the patient complains only of the following symptoms, headaches and all other reviewed systems are negative.   ALLERGIES: Allergies  Allergen Reactions   Ketamine Other (See Comments)    "I hallucinated terribly"     HOME MEDICATIONS: Outpatient Medications Prior to Visit  Medication Sig Dispense Refill   calcium carbonate (TUMS EX) 750 MG chewable tablet Chew by mouth.     Cholecalciferol (VITAMIN D3 PO) Take 5,000 Units by mouth daily.     Galcanezumab-gnlm (EMGALITY) 120 MG/ML SOAJ Inject 120 mg into the skin every 30 (thirty) days. 3 mL 3   ibuprofen (ADVIL) 800 MG tablet Take 800 mg by mouth every 8 (eight) hours as needed.     rizatriptan (MAXALT-MLT) 10 MG disintegrating tablet Take 1 tablet (10 mg total) by mouth as needed for migraine. May repeat in 2 hours if needed 9 tablet 11   rosuvastatin (CRESTOR) 10 MG tablet Take 10 mg by mouth at bedtime.     valsartan-hydrochlorothiazide (DIOVAN-HCT) 160-25 MG tablet Take 1 tablet by mouth daily.     Galcanezumab-gnlm (EMGALITY) 120 MG/ML SOAJ Administer 2 injections for starter dose, continue 1 injection every 30 days following 2 mL 0   topiramate (TOPAMAX) 100 MG tablet Take 1 tablet (  100 mg total) by mouth 2 (two) times daily. 180 tablet 4   No facility-administered medications prior to visit.     PAST MEDICAL HISTORY: Past Medical History:  Diagnosis Date   Carpal tunnel syndrome    Cervical arthritis    Hyperlipidemia    Hypertension    Medial meniscus tear    right   Radiculopathy    Spondylosis of cervical spine    Thyroid nodule      PAST SURGICAL HISTORY: Past Surgical  History:  Procedure Laterality Date   ABDOMINAL HYSTERECTOMY  06/1996   ANTERIOR CERVICAL DECOMP/DISCECTOMY FUSION  04/2017   CARPAL TUNNEL RELEASE Right 2013   CERVICAL ABLATION Left 07/2019   left neck cervical ablation  06/2019   MENISCUS REPAIR Right 2018   ROTATOR CUFF REPAIR Left 2019     FAMILY HISTORY: Family History  Problem Relation Age of Onset   Hypertension Father    High Cholesterol Father    Hypertension Paternal Grandmother    High Cholesterol Paternal Grandmother    Thyroid disease Paternal Grandmother    Diabetes Paternal Grandmother    Breast cancer Daughter 68       rare breast ca found. genetic testing. connection with paternal pancreatic ca.     SOCIAL HISTORY: Social History   Socioeconomic History   Marital status: Widowed    Spouse name: Not on file   Number of children: 3   Years of education: Not on file   Highest education level: Associate degree: academic program  Occupational History    Comment: RN  Tobacco Use   Smoking status: Former    Types: Cigarettes    Quit date: 06/09/2016    Years since quitting: 4.2   Smokeless tobacco: Never  Substance and Sexual Activity   Alcohol use: Not Currently   Drug use: Never   Sexual activity: Not on file  Other Topics Concern   Not on file  Social History Narrative   Lives alone   Caffeine- 3-5 c daily   Social Determinants of Health   Financial Resource Strain: Not on file  Food Insecurity: Not on file  Transportation Needs: Not on file  Physical Activity: Not on file  Stress: Not on file  Social Connections: Not on file  Intimate Partner Violence: Not on file      PHYSICAL EXAM  Vitals:   09/10/20 0910  BP: 93/65  Pulse: 87  Weight: 161 lb 8 oz (73.3 kg)  Height: 5\' 3"  (1.6 m)    Body mass index is 28.61 kg/m.   Generalized: Well developed, in no acute distress  Cardiology: normal rate and rhythm, no murmur auscultated  Respiratory: clear to auscultation bilaterally     Neurological examination  Mentation: Alert oriented to time, place, history taking. Follows all commands speech and language fluent Cranial nerve II-XII: Pupils were equal round reactive to light. Extraocular movements were full, visual field were full on confrontational test. Facial sensation and strength were normal. Head turning and shoulder shrug  were normal and symmetric. Motor: The motor testing reveals 5 over 5 strength of all 4 extremities. Good symmetric motor tone is noted throughout.  Gait and station: Gait is normal.     DIAGNOSTIC DATA (LABS, IMAGING, TESTING) - I reviewed patient records, labs, notes, testing and imaging myself where available.  No results found for: WBC, HGB, HCT, MCV, PLT No results found for: NA, K, CL, CO2, GLUCOSE, BUN, CREATININE, CALCIUM, PROT, ALBUMIN, AST, ALT, ALKPHOS, BILITOT, GFRNONAA,  GFRAA No results found for: CHOL, HDL, LDLCALC, LDLDIRECT, TRIG, CHOLHDL No results found for: PRFF6B No results found for: VITAMINB12 No results found for: TSH  No flowsheet data found.   No flowsheet data found.   ASSESSMENT AND PLAN  60 y.o. year old female  has a past medical history of Carpal tunnel syndrome, Cervical arthritis, Hyperlipidemia, Hypertension, Medial meniscus tear, Radiculopathy, Spondylosis of cervical spine, and Thyroid nodule. here with    Chronic migraine with aura  She is doing much better. Migraines are less frequent and less severe. We will continue topiramate 100mg  twice daily, Emgality every 30 days and rizatriptan as needed. She will continue healthy lifestyle habits. Continue follow up with spine specialist and PCP. Follow up with me in 3 months.    No orders of the defined types were placed in this encounter.    Meds ordered this encounter  Medications   topiramate (TOPAMAX) 100 MG tablet    Sig: Take 1 tablet (100 mg total) by mouth 2 (two) times daily.    Dispense:  180 tablet    Refill:  4    Order Specific  Question:   Supervising Provider    Answer:   Anson Fret      [[id number]], MSN, FNP-C 09/10/2020, 9:34 AM  [facility] [address] [place], [place] [phone]

## 2020-09-09 NOTE — Patient Instructions (Signed)
Below is our plan:  We will continue topiramate 100mg  twice daily and Emgality every month. Continue rizatriptan as needed.   Please make sure you are staying well hydrated. I recommend 50-60 ounces daily. Well balanced diet and regular exercise encouraged. Consistent sleep schedule with 6-8 hours recommended.   Please continue follow up with care team as directed.   Follow up with me in 1 year   You may receive a survey regarding today's visit. I encourage you to leave honest feed back as I do use this information to improve patient care. Thank you for seeing me today!

## 2020-09-10 ENCOUNTER — Encounter: Payer: Self-pay | Admitting: Family Medicine

## 2020-09-10 ENCOUNTER — Ambulatory Visit: Payer: No Typology Code available for payment source | Admitting: Family Medicine

## 2020-09-10 VITALS — BP 93/65 | HR 87 | Ht 63.0 in | Wt 161.5 lb

## 2020-09-10 DIAGNOSIS — G43109 Migraine with aura, not intractable, without status migrainosus: Secondary | ICD-10-CM | POA: Diagnosis not present

## 2020-09-10 MED ORDER — TOPIRAMATE 100 MG PO TABS: 100.0000 mg | ORAL_TABLET | Freq: Two times a day (BID) | ORAL | 4 refills | Status: DC

## 2020-10-04 IMAGING — US US THYROID
1 series · 13 of 25 positions shown · non-contrast
Comparison: 09/05/2019

CLINICAL DATA: Right thyroid nodules by carotid ultrasound

EXAM:
THYROID ULTRASOUND
TECHNIQUE: Ultrasound examination of the thyroid gland and adjacent soft
tissues was performed.

[Series 1: us thyroid · 0.06mm/px · 13 of 43 slices shown]
[im 1/43]
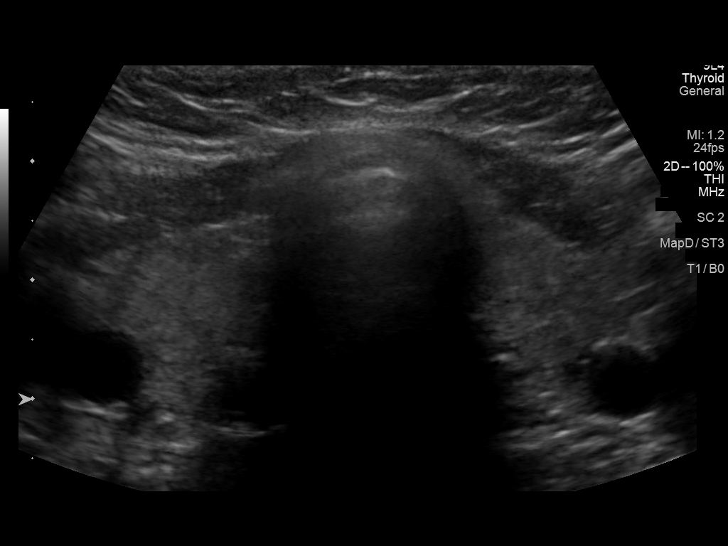
[im 4/43]
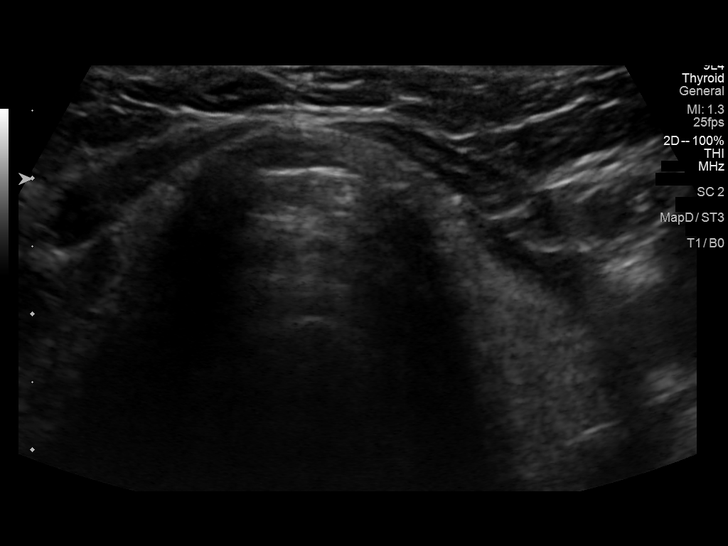
[im 8/43]
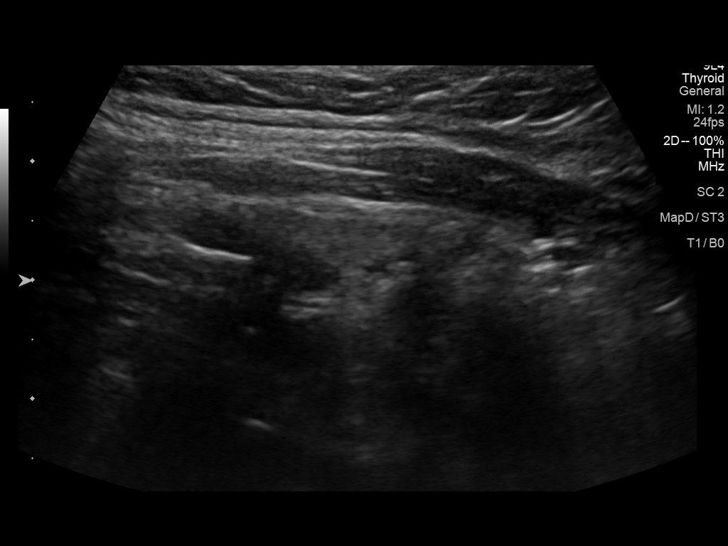
[im 11/43]
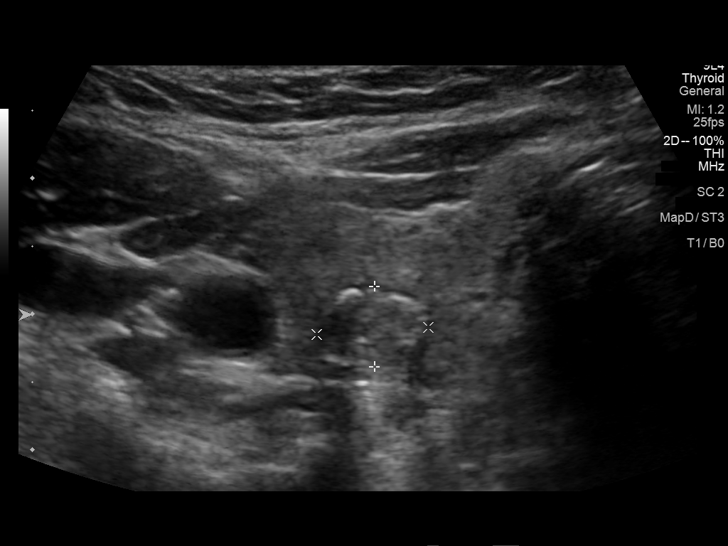
[im 15/43]
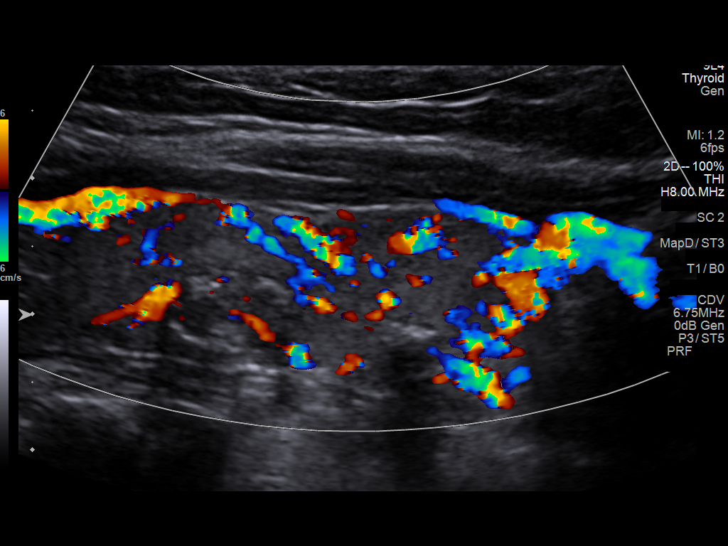
[im 18/43]
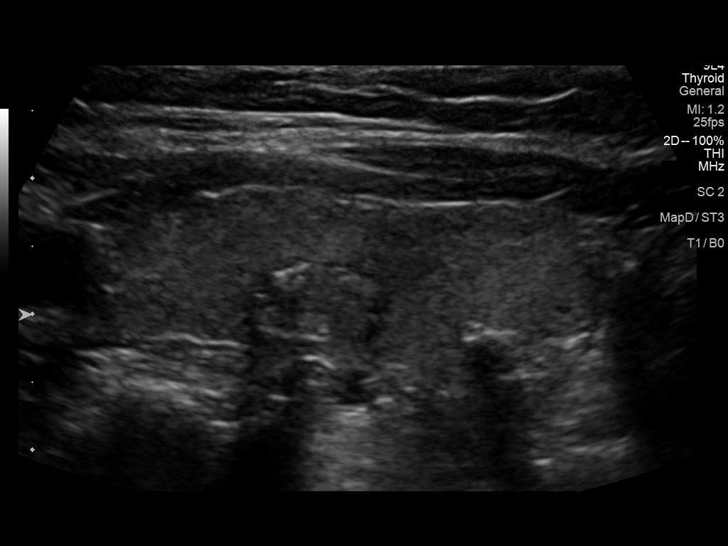
[im 22/43]
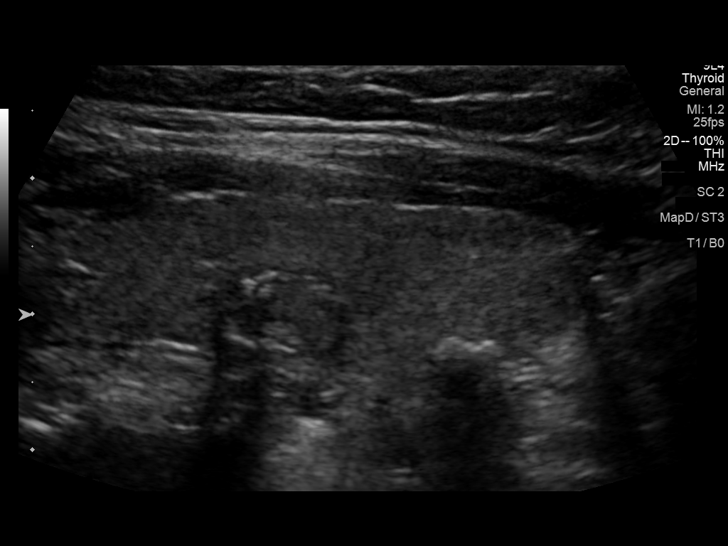
[im 25/43]
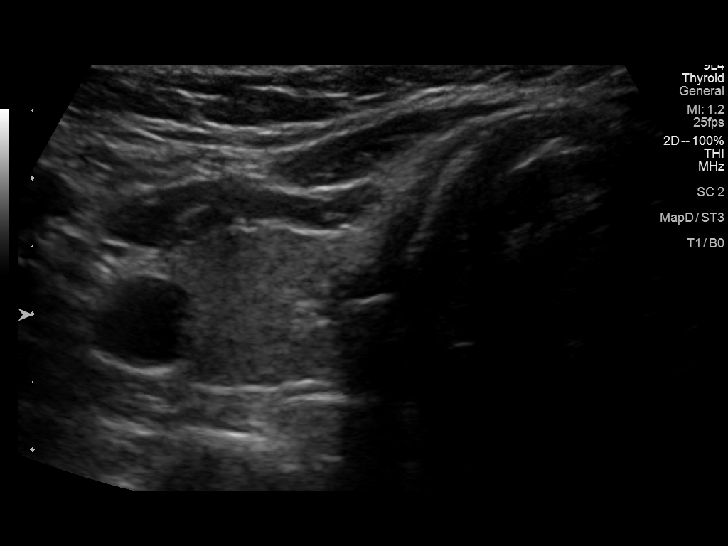
[im 29/43]
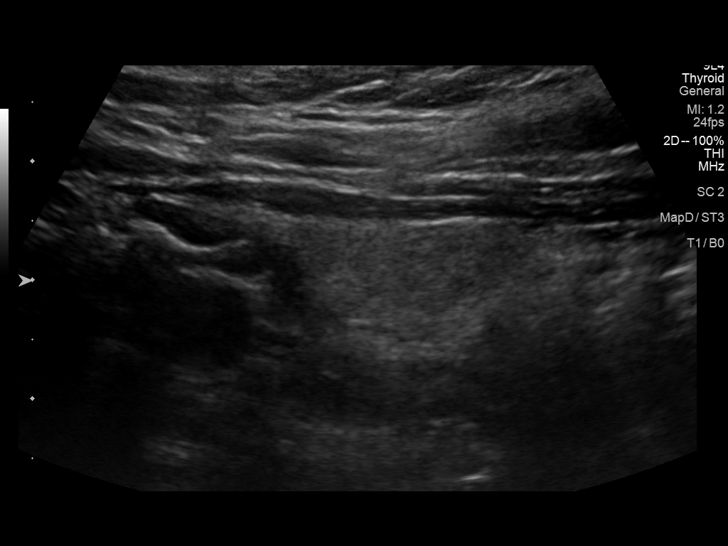
[im 32/43]
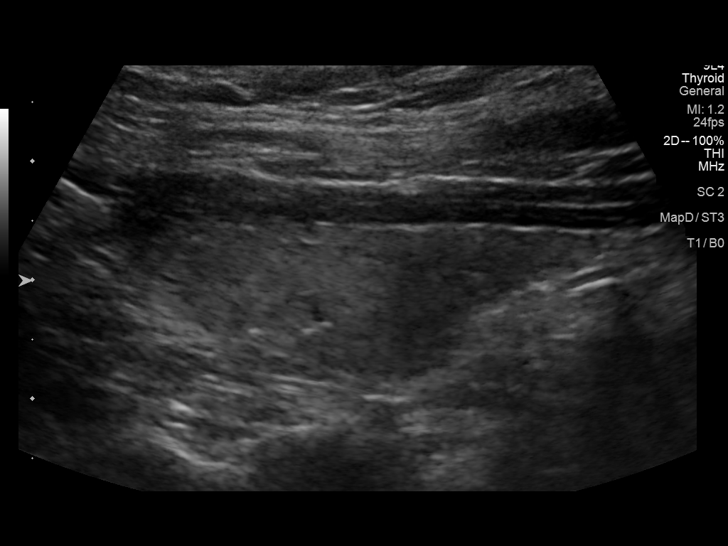
[im 36/43]
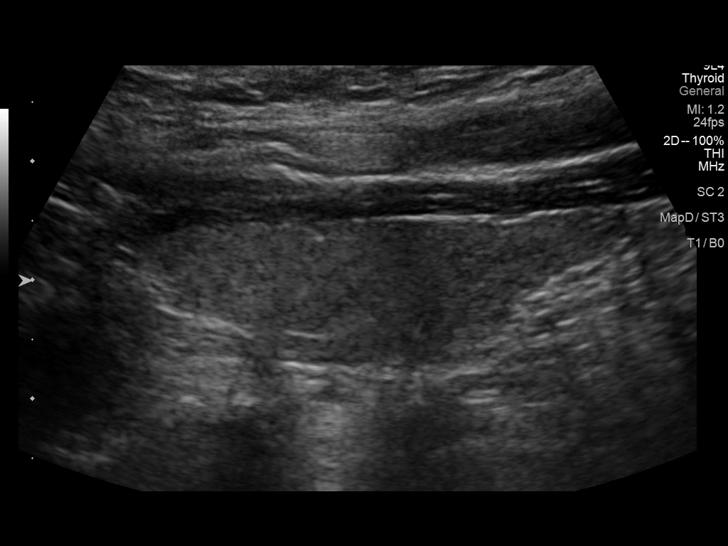
[im 39/43]
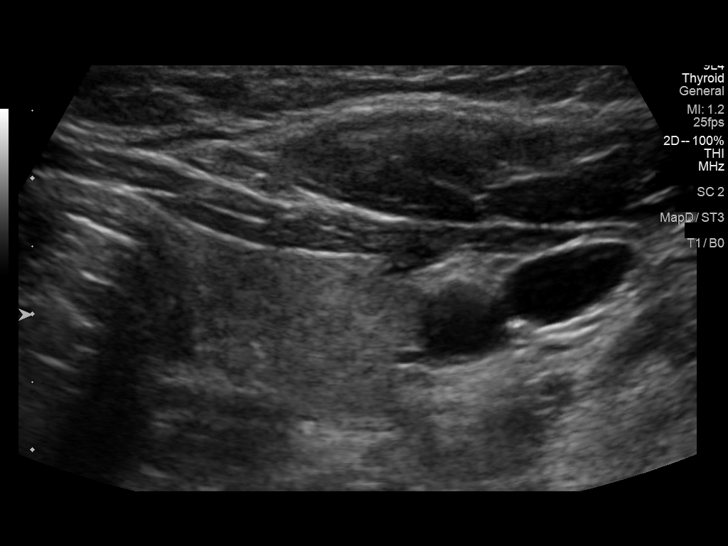
[im 43/43]
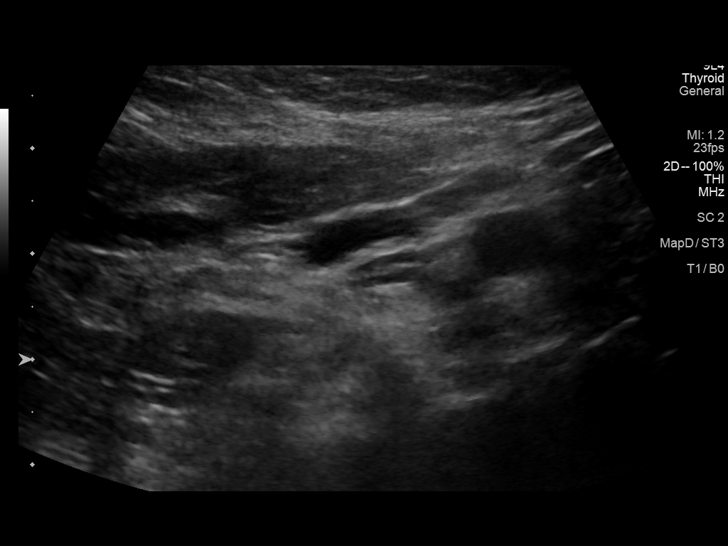

[13 of 25 positions shown; findings below may reference images not displayed]

FINDINGS: Parenchymal Echotexture: Normal

Isthmus: 3 mm

Right lobe: 4.2 x 1.4 x 2.0 cm

Left lobe: 4.2 x 1.3 x 1.7 cm

_________________________________________________________

Estimated total number of nodules >/= 1 cm: 1

Number of spongiform nodules >/=  2 cm not described below (TR1): 0

Number of mixed cystic and solid nodules >/= 1.5 cm not described
below (TR2): 0

_________________________________________________________

Nodule # 1:

Location: Right; Mid

Maximum size: 1.0 cm; Other 2 dimensions: 0.8 x 0.6 cm

Composition: solid/almost completely solid (2)

Echogenicity: isoechoic (1)

Shape: not taller-than-wide (0)

Margins: ill-defined (0)

Echogenic foci: peripheral calcifications (2)

ACR TI-RADS total points: 5.

ACR TI-RADS risk category: TR4 (4-6 points).

ACR TI-RADS recommendations:

*Given size (>/= 1 - 1.4 cm) and appearance, a follow-up ultrasound
in 1 year should be considered based on TI-RADS criteria.

_________________________________________________________

Right inferior thyroid shadowing dystrophic calcification measures 6
mm.

No significant left thyroid abnormality. No hypervascularity or
regional adenopathy.
IMPRESSION: 1 cm right mid thyroid TR 4 nodule meets criteria for follow-up in 1
year.

The above is in keeping with the ACR TI-RADS recommendations - [HOSPITAL] 8448;[DATE].

## 2020-12-01 ENCOUNTER — Encounter: Payer: Self-pay | Admitting: Family Medicine

## 2021-04-30 ENCOUNTER — Other Ambulatory Visit: Payer: Self-pay | Admitting: Nurse Practitioner

## 2021-04-30 DIAGNOSIS — E041 Nontoxic single thyroid nodule: Secondary | ICD-10-CM

## 2021-06-04 ENCOUNTER — Other Ambulatory Visit: Payer: Self-pay | Admitting: Neurology

## 2021-06-04 ENCOUNTER — Encounter: Payer: Self-pay | Admitting: Family Medicine

## 2021-06-04 DIAGNOSIS — G43109 Migraine with aura, not intractable, without status migrainosus: Secondary | ICD-10-CM

## 2021-06-04 MED ORDER — EMGALITY 120 MG/ML ~~LOC~~ SOAJ: 120.0000 mg | SUBCUTANEOUS | 3 refills | Status: DC

## 2021-07-09 ENCOUNTER — Ambulatory Visit
Admission: RE | Admit: 2021-07-09 | Discharge: 2021-07-09 | Disposition: A | Discharge status: Home / Self Care | Payer: PRIVATE HEALTH INSURANCE | Source: Ambulatory Visit | Attending: Nurse Practitioner | Admitting: Nurse Practitioner

## 2021-07-09 DIAGNOSIS — E041 Nontoxic single thyroid nodule: Secondary | ICD-10-CM

## 2021-07-29 ENCOUNTER — Ambulatory Visit: Payer: PRIVATE HEALTH INSURANCE | Admitting: Family Medicine

## 2021-07-29 ENCOUNTER — Encounter: Payer: Self-pay | Admitting: Family Medicine

## 2021-07-29 VITALS — BP 102/64 | HR 80 | Ht 63.0 in | Wt 148.8 lb

## 2021-07-29 DIAGNOSIS — G43109 Migraine with aura, not intractable, without status migrainosus: Secondary | ICD-10-CM

## 2021-07-29 IMAGING — US US THYROID
1 series · 13 of 25 positions shown · non-contrast
Comparison: 09/18/2019

CLINICAL DATA: Prior ultrasound follow-up. Right thyroid nodule
meeting criteria for follow-up ultrasound.

EXAM:
THYROID ULTRASOUND
TECHNIQUE: Ultrasound examination of the thyroid gland and adjacent soft
tissues was performed.

[Series 1: us thyroid · 0.05mm/px · 13 of 50 slices shown]
[im 1/50]
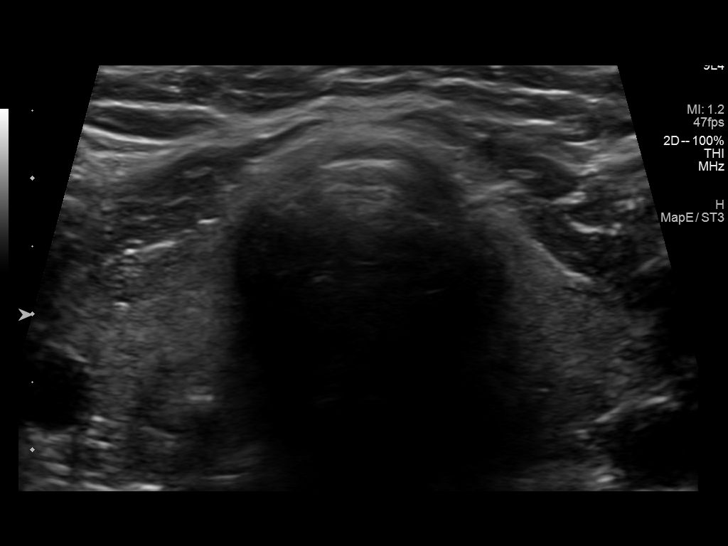
[im 5/50]
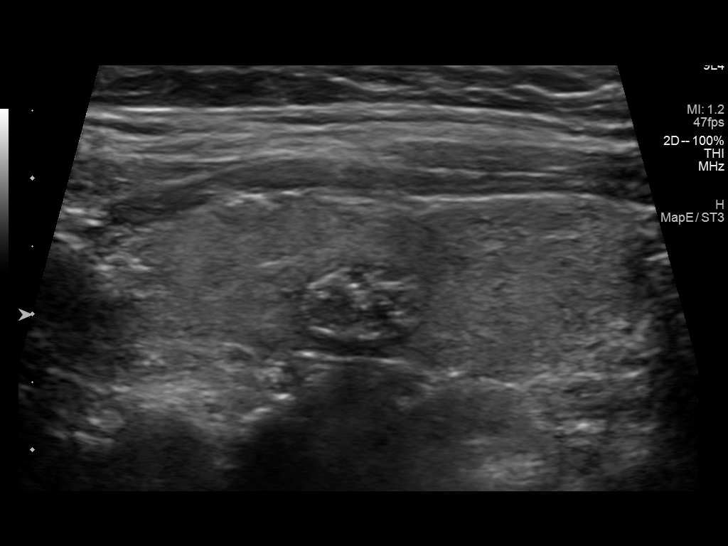
[im 9/50]
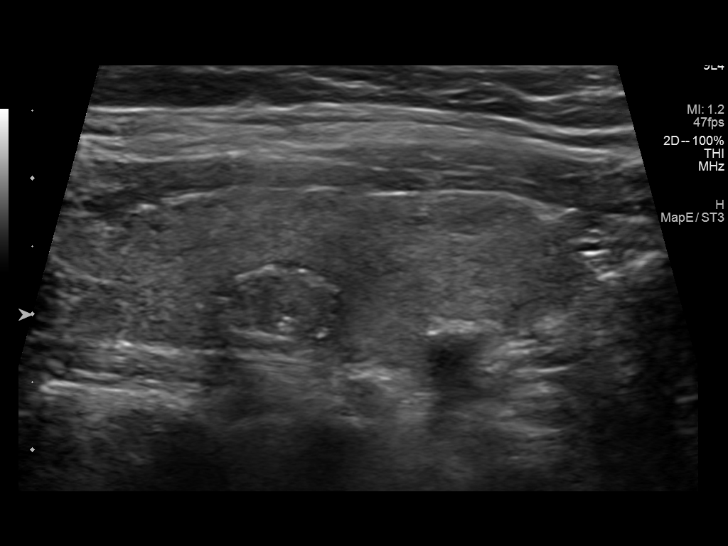
[im 13/50]
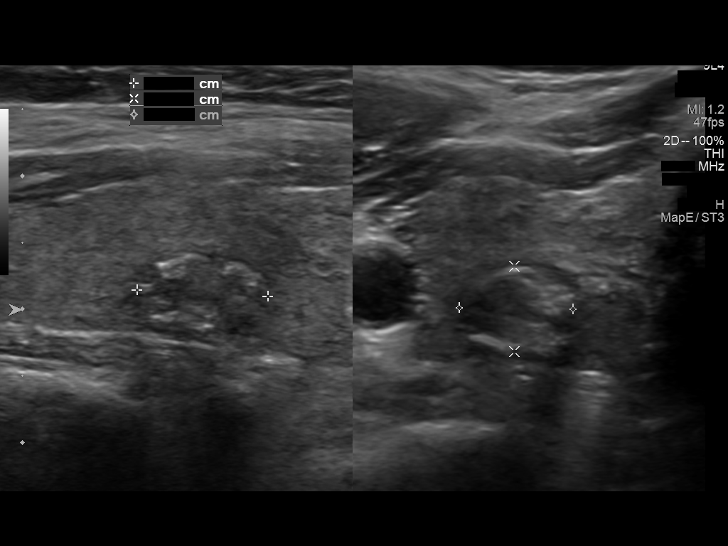
[im 17/50]
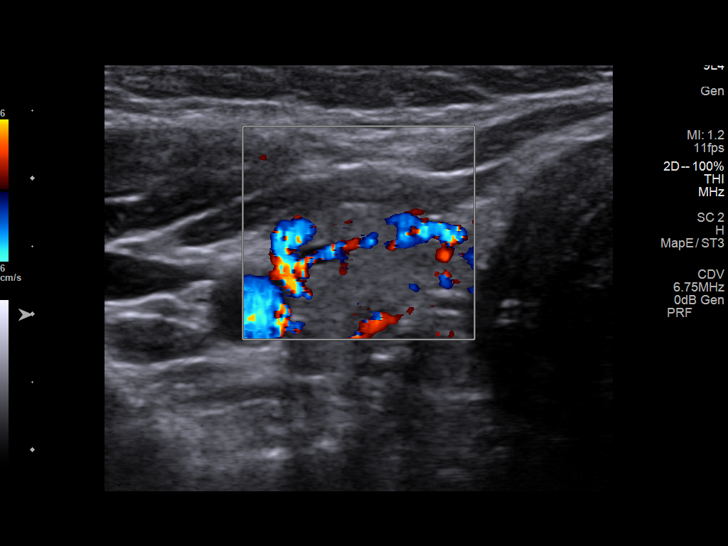
[im 21/50]
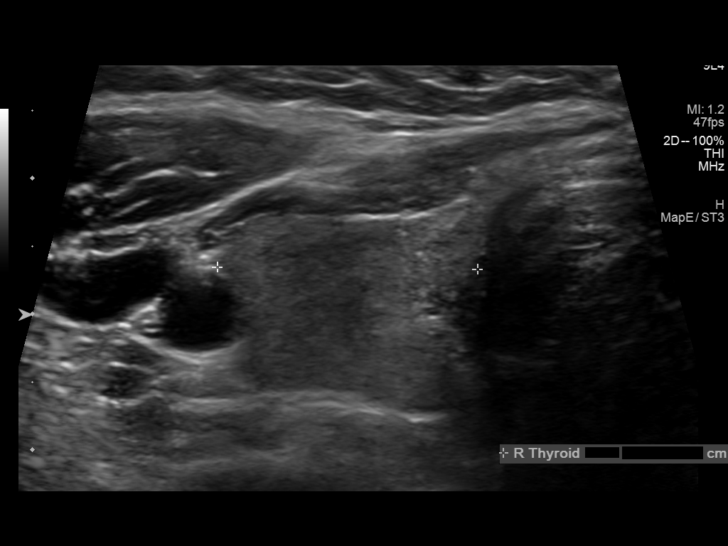
[im 25/50]
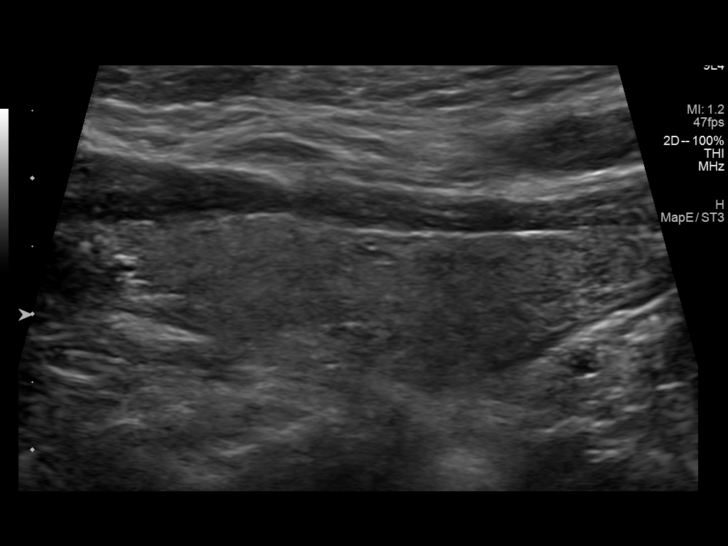
[im 29/50]
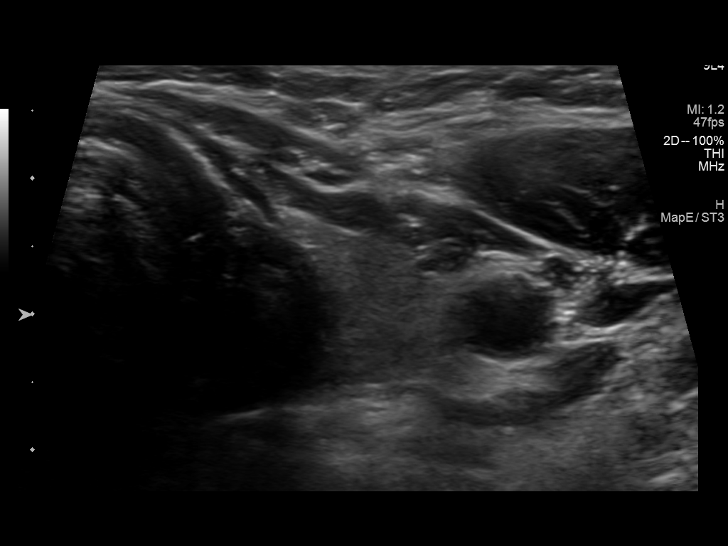
[im 33/50]
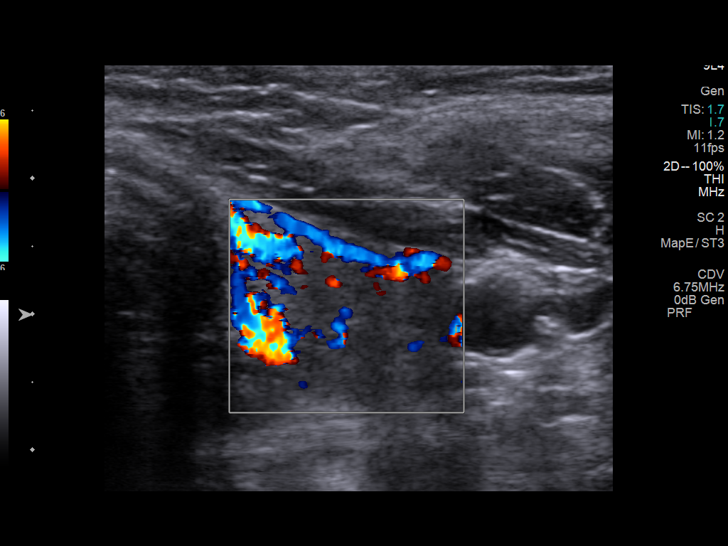
[im 37/50]
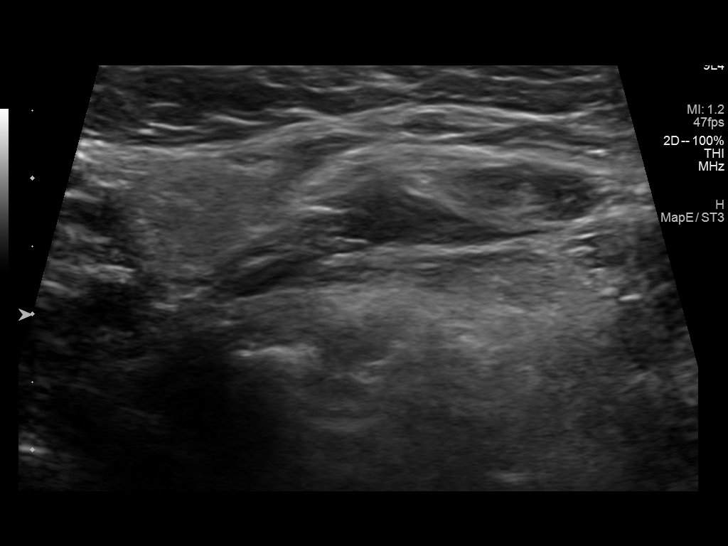
[im 41/50]
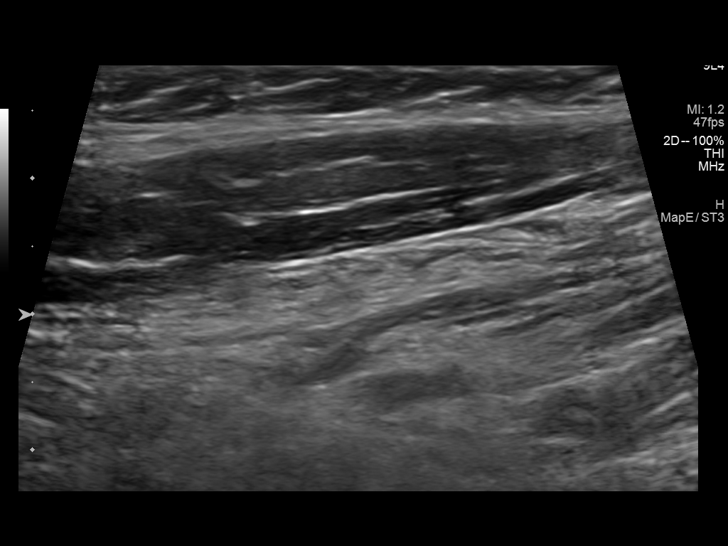
[im 45/50]
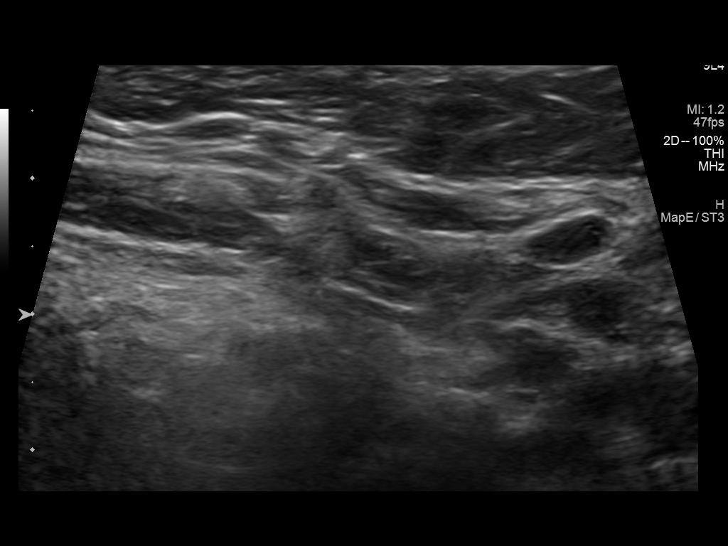
[im 50/50]
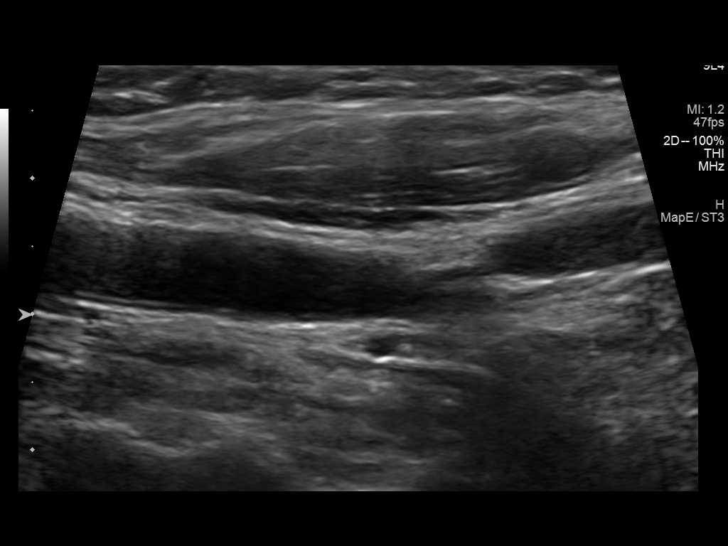

[13 of 25 positions shown; findings below may reference images not displayed]

FINDINGS: Parenchymal Echotexture: Normal

Isthmus: 0.1 cm

Right lobe: 4.1 x 1.4 x 1.9 cm

Left lobe: 3.9 x 1.2 x 1.5 cm

_________________________________________________________

Estimated total number of nodules >/= 1 cm: 1

Number of spongiform nodules >/=  2 cm not described below (TR1): 0

Number of mixed cystic and solid nodules >/= 1.5 cm not described
below (TR2): 0

_________________________________________________________

Nodule # 1:

Prior biopsy: No

Location: Right; Mid

Maximum size: 1.0 cm; Other 2 dimensions: 0.6 x 0.8 cm, previously,
1.0 cm

Composition: solid/almost completely solid (2)

Echogenicity: isoechoic (1)

Shape: not taller-than-wide (0)

Margins: smooth (0)

Echogenic foci: peripheral calcifications (2)

ACR TI-RADS total points: 5.

ACR TI-RADS risk category:  TR4 (4-6 points).

Significant change in size (>/= 20% in two dimensions and minimal
increase of 2 mm): No

Change in features: No

Change in ACR TI-RADS risk category: No

ACR TI-RADS recommendations:

Stable right thyroid nodule. Additional sonographic surveillance is
indicated. Given size (>/= 1 - 1.4 cm) and appearance, a follow-up
ultrasound in 1 year should be considered based on TI-RADS criteria.

_________________________________________________________

No abnormal lymph nodes identified by ultrasound.
IMPRESSION: Stable 1 cm right thyroid nodule. Recommend follow-up ultrasound in
1 year.

The above is in keeping with the ACR TI-RADS recommendations - [HOSPITAL] 1685;[DATE].

## 2021-07-29 MED ORDER — TOPIRAMATE 100 MG PO TABS: 100.0000 mg | ORAL_TABLET | Freq: Two times a day (BID) | ORAL | 4 refills | Status: DC

## 2021-07-29 MED ORDER — RIZATRIPTAN BENZOATE 10 MG PO TBDP: 10.0000 mg | ORAL_TABLET | ORAL | 11 refills | Status: DC | PRN

## 2021-07-29 NOTE — Progress Notes (Signed)
Chief Complaint  Patient presents with   Follow-up    Rm 6, alone. Here for yearly migraine f/u. Pt reports doing well on Emgality. Having 2 migraines a  month. Doing well on topiramate and rizatriptan, which shes only had to use a couple times.     HISTORY OF PRESENT ILLNESS:  07/29/21 ALL:  Renee Scott returns for follow up for headaches. She continues Emgality, topiramate 100mg  BID and rizatriptan. She is doing very well. She may have 2-3 headache days a month. She has only used rizatriptan 3-4 times over the past year. She is usually able to abort headache with Motrin. Fluorescent lights are main trigger. She is followed closely by PCP.   09/10/2020 ALL:  Renee Scott returns for headache follow up. We continued topiramate 100mg  BID and added Emgality at last visit 05/2020. Rizatriptan started for abortive therapy. She reports significant improvement in migraines. She is tolerating meds well. She has used 12 rizatriptan tablets in the last three months. It works well.   06/11/2020 ALL:  Renee Scott is a 61 y.o. female here today for follow up for left sided headaches. MRI was unremarkable. Topiramate was increased to 100mg  BID. Since, headaches are less frequent. She reports that headaches are fairly severe when she does get them. She has left sided pounding, throbbing headaches. Occurring daily. Can last minutes to hours. She has light and sound sensitivity. She describes having wavy vision in left eye prior to headache. She has tried Aleve and Motrin with minimal relief. She has cut back and no longer taking daily. She does not smoke.   HISTORY (copied from Dr Alm Bustard previous note)  61 year old female here for evaluation of headaches.   Patient has complex surgical history including C4-C6 ACDF in 2019, cervical nerve root ablation in July 2021, history of carpal tunnel release surgery, right knee meniscus tear surgery, rotator cuff repair.    Following cervical ablation surgery  in July 2021 patient experienced increasing pain in the left posterior neck, left occipital region.  In September 2021 patient had increasing left-sided headaches, left retro-orbital pain, photophobia, blurred vision, scalp sensitivity.  Patient went to ophthalmology clinic was evaluated for temporal arteritis or other primary ocular pathologies which were ruled out.   No prior history of migraine.  Has family history of migraine in a cousin.  Headaches last 20 to 25 minutes at a time, multiple times a day.  She takes Aleve and Motrin multiple times on a daily basis.  No other specific triggering or aggravating factors.   REVIEW OF SYSTEMS: Out of a complete 14 system review of symptoms, the patient complains only of the following symptoms, headaches and all other reviewed systems are negative.   ALLERGIES: Allergies  Allergen Reactions   Ketamine Other (See Comments)    "I hallucinated terribly"     HOME MEDICATIONS: Outpatient Medications Prior to Visit  Medication Sig Dispense Refill   calcium carbonate (TUMS EX) 750 MG chewable tablet Chew by mouth.     Cholecalciferol (VITAMIN D3 PO) Take 5,000 Units by mouth daily.     Galcanezumab-gnlm (EMGALITY) 120 MG/ML SOAJ Inject 120 mg into the skin every 30 (thirty) days. 3 mL 3   ibuprofen (ADVIL) 800 MG tablet Take 800 mg by mouth every 8 (eight) hours as needed.     rosuvastatin (CRESTOR) 10 MG tablet Take 10 mg by mouth at bedtime.     valsartan-hydrochlorothiazide (DIOVAN-HCT) 160-25 MG tablet Take 1 tablet by mouth daily.  rizatriptan (MAXALT-MLT) 10 MG disintegrating tablet Take 1 tablet (10 mg total) by mouth as needed for migraine. May repeat in 2 hours if needed 9 tablet 11   topiramate (TOPAMAX) 100 MG tablet Take 1 tablet (100 mg total) by mouth 2 (two) times daily. 180 tablet 4   No facility-administered medications prior to visit.     PAST MEDICAL HISTORY: Past Medical History:  Diagnosis Date   Carpal tunnel syndrome     Cervical arthritis    Hyperlipidemia    Hypertension    Medial meniscus tear    right   Radiculopathy    Spondylosis of cervical spine    Thyroid nodule      PAST SURGICAL HISTORY: Past Surgical History:  Procedure Laterality Date   ABDOMINAL HYSTERECTOMY  06/1996   ANTERIOR CERVICAL DECOMP/DISCECTOMY FUSION  04/2017   CARPAL TUNNEL RELEASE Right 2013   CERVICAL ABLATION Left 07/2019   left neck cervical ablation  06/2019   MENISCUS REPAIR Right 2018   ROTATOR CUFF REPAIR Left 2019     FAMILY HISTORY: Family History  Problem Relation Age of Onset   Hypertension Father    High Cholesterol Father    Hypertension Paternal Grandmother    High Cholesterol Paternal Grandmother    Thyroid disease Paternal Grandmother    Diabetes Paternal Grandmother    Breast cancer Daughter 60       rare breast ca found. genetic testing. connection with paternal pancreatic ca.     SOCIAL HISTORY: Social History   Socioeconomic History   Marital status: Widowed    Spouse name: Not on file   Number of children: 3   Years of education: Not on file   Highest education level: Associate degree: academic program  Occupational History    Comment: RN  Tobacco Use   Smoking status: Former    Types: Cigarettes    Quit date: 06/09/2016    Years since quitting: 5.1   Smokeless tobacco: Never  Substance and Sexual Activity   Alcohol use: Not Currently   Drug use: Never   Sexual activity: Not on file  Other Topics Concern   Not on file  Social History Narrative   Lives alone   Caffeine- 3-5 c daily   Social Determinants of Health   Financial Resource Strain: Not on file  Food Insecurity: Not on file  Transportation Needs: Not on file  Physical Activity: Not on file  Stress: Not on file  Social Connections: Not on file  Intimate Partner Violence: Not on file      PHYSICAL EXAM  Vitals:   07/29/21 1315  BP: 102/64  Pulse: 80  Weight: 148 lb 12.8 oz (67.5 kg)  Height:  5\' 3"  (1.6 m)     Body mass index is 26.36 kg/m.   Generalized: Well developed, in no acute distress  Cardiology: normal rate and rhythm, no murmur auscultated  Respiratory: clear to auscultation bilaterally    Neurological examination  Mentation: Alert oriented to time, place, history taking. Follows all commands speech and language fluent Cranial nerve II-XII: Pupils were equal round reactive to light. Extraocular movements were full, visual field were full on confrontational test. Facial sensation and strength were normal. Head turning and shoulder shrug  were normal and symmetric. Motor: The motor testing reveals 5 over 5 strength of all 4 extremities. Good symmetric motor tone is noted throughout.  Gait and station: Gait is normal.     DIAGNOSTIC DATA (LABS, IMAGING, TESTING) - I reviewed  patient records, labs, notes, testing and imaging myself where available.  No results found for: "WBC", "HGB", "HCT", "MCV", "PLT" No results found for: "NA", "K", "CL", "CO2", "GLUCOSE", "BUN", "CREATININE", "CALCIUM", "PROT", "ALBUMIN", "AST", "ALT", "ALKPHOS", "BILITOT", "GFRNONAA", "GFRAA" No results found for: "CHOL", "HDL", "LDLCALC", "LDLDIRECT", "TRIG", "CHOLHDL" No results found for: "HGBA1C" No results found for: "VITAMINB12" No results found for: "TSH"      No data to display               No data to display           ASSESSMENT AND PLAN  61 y.o. year old female  has a past medical history of Carpal tunnel syndrome, Cervical arthritis, Hyperlipidemia, Hypertension, Medial meniscus tear, Radiculopathy, Spondylosis of cervical spine, and Thyroid nodule. here with    Chronic migraine with aura - Plan: rizatriptan (MAXALT-MLT) 10 MG disintegrating tablet  She is doing much better. Migraines are less frequent and less severe. We will continue topiramate 100mg  twice daily, Emgality every 30 days and rizatriptan as needed. She will continue healthy lifestyle habits.  Continue follow up with spine specialist and PCP. Follow up with me in 1 year.    No orders of the defined types were placed in this encounter.     Meds ordered this encounter  Medications   rizatriptan (MAXALT-MLT) 10 MG disintegrating tablet    Sig: Take 1 tablet (10 mg total) by mouth as needed for migraine. May repeat in 2 hours if needed    Dispense:  9 tablet    Refill:  11   topiramate (TOPAMAX) 100 MG tablet    Sig: Take 1 tablet (100 mg total) by mouth 2 (two) times daily.    Dispense:  180 tablet    Refill:  4      Antwaun Buth, MSN, FNP-C 07/29/2021, 1:49 PM  [facility] [address], [address] [place], [place] [id number]

## 2021-07-29 NOTE — Patient Instructions (Signed)
Below is our plan:  We will continue Emgality every month, topiramate 100mg  twice daily and rizatriptan as needed.   Please make sure you are staying well hydrated. I recommend 50-60 ounces daily. Well balanced diet and regular exercise encouraged. Consistent sleep schedule with 6-8 hours recommended.   Please continue follow up with care team as directed.   Follow up with me in 1 year   You may receive a survey regarding today's visit. I encourage you to leave honest feed back as I do use this information to improve patient care. Thank you for seeing me today!

## 2021-09-10 ENCOUNTER — Ambulatory Visit: Payer: No Typology Code available for payment source | Admitting: Family Medicine

## 2021-11-07 ENCOUNTER — Encounter: Payer: Self-pay | Admitting: Family Medicine

## 2021-11-10 ENCOUNTER — Other Ambulatory Visit: Payer: Self-pay | Admitting: *Deleted

## 2021-11-10 MED ORDER — TOPIRAMATE 100 MG PO TABS: 100.0000 mg | ORAL_TABLET | Freq: Two times a day (BID) | ORAL | 2 refills | Status: DC

## 2022-07-26 IMAGING — US US THYROID
1 series · 13 of 25 positions shown · non-contrast
Comparison: 07/12/2020

CLINICAL DATA: Thyroid nodule

EXAM:
THYROID ULTRASOUND
TECHNIQUE: Ultrasound examination of the thyroid gland and adjacent soft
tissues was performed.

[Series 1: us thyroid · 0.06mm/px · 13 of 38 slices shown]
[im 1/38]
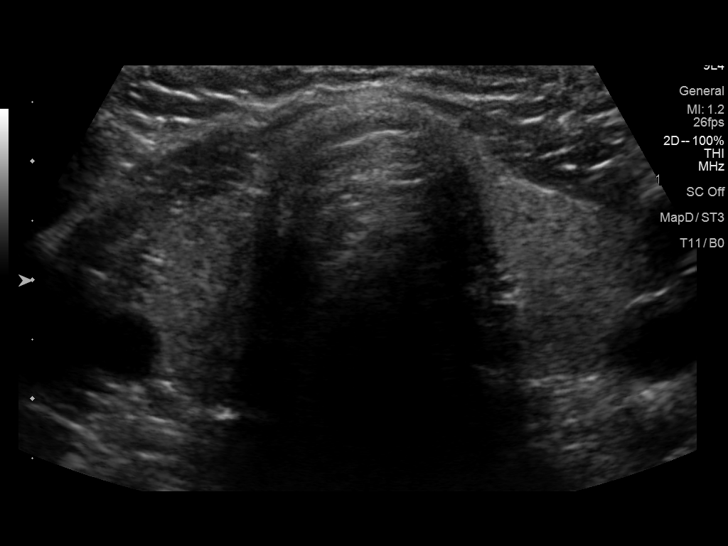
[im 4/38]
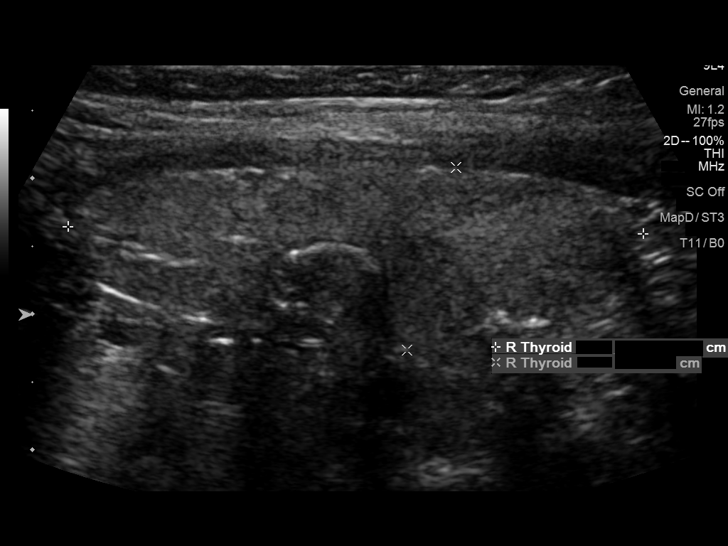
[im 7/38]
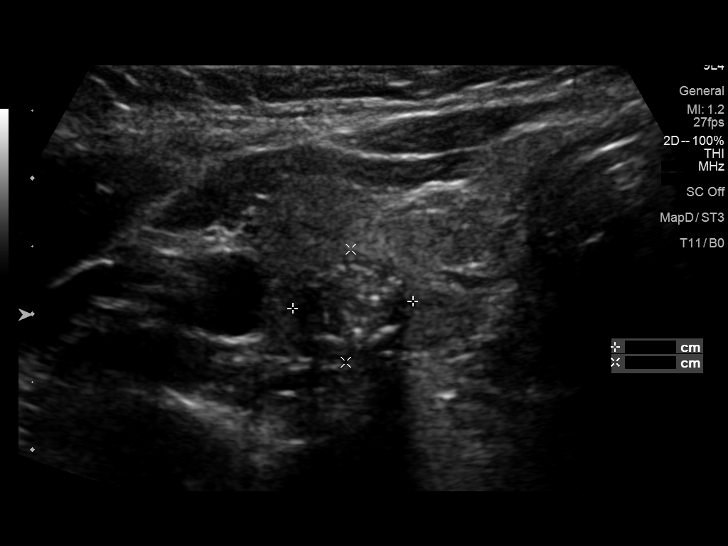
[im 10/38]
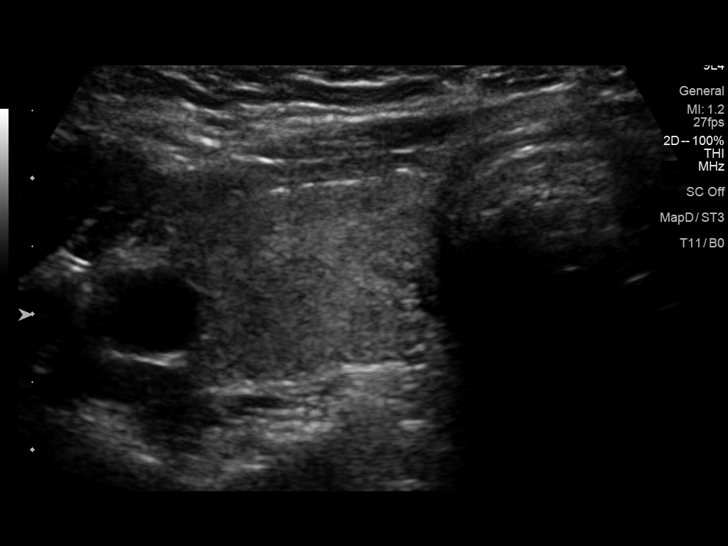
[im 13/38]
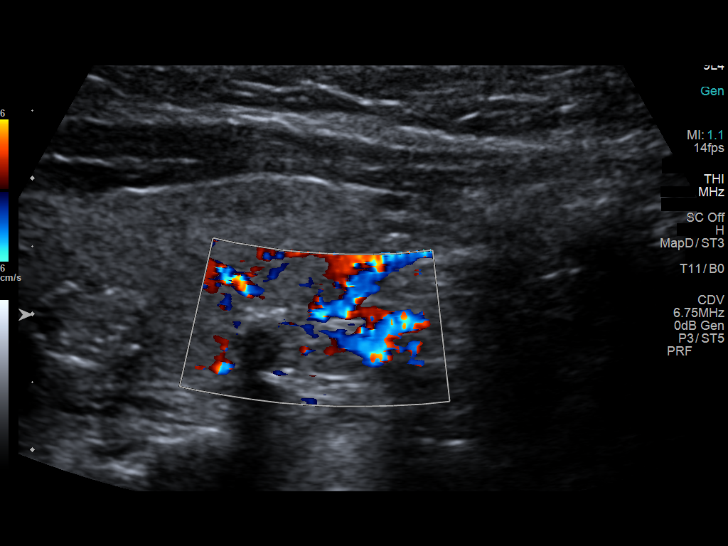
[im 16/38]
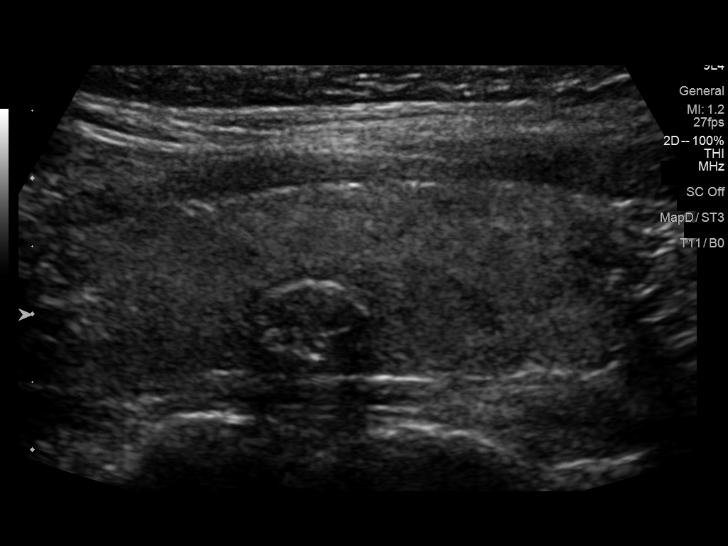
[im 19/38]
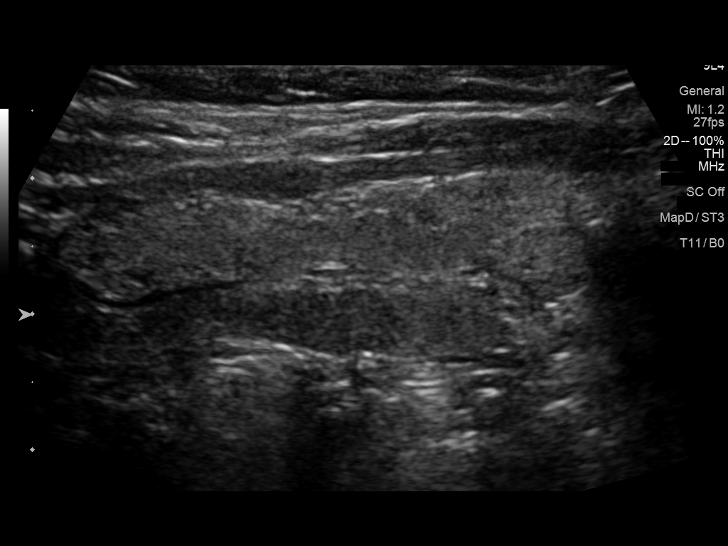
[im 22/38]
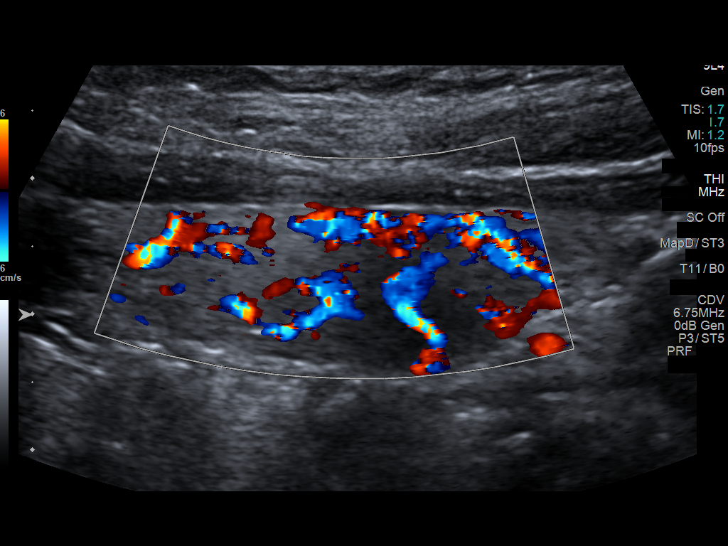
[im 25/38]
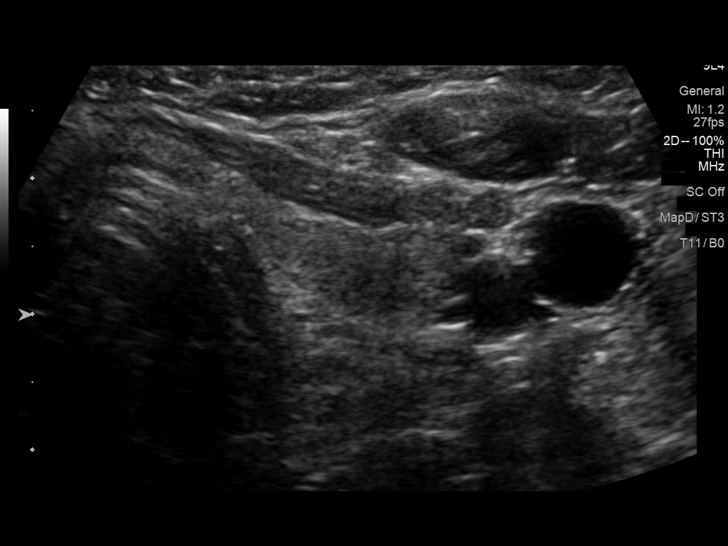
[im 28/38]
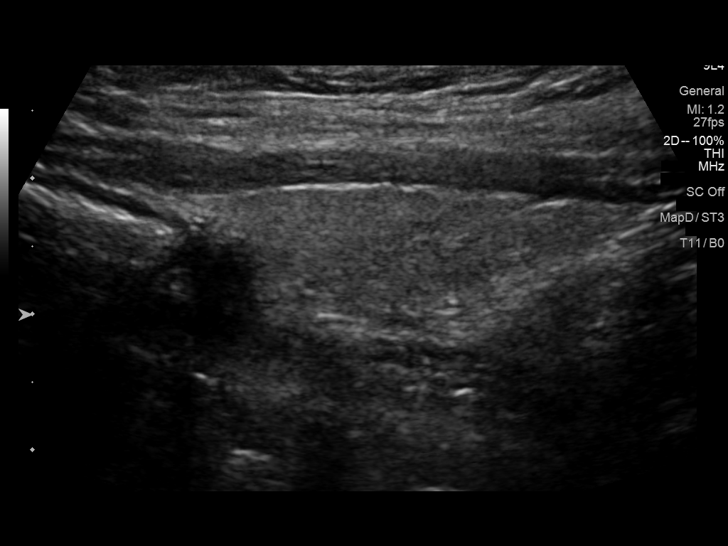
[im 31/38]
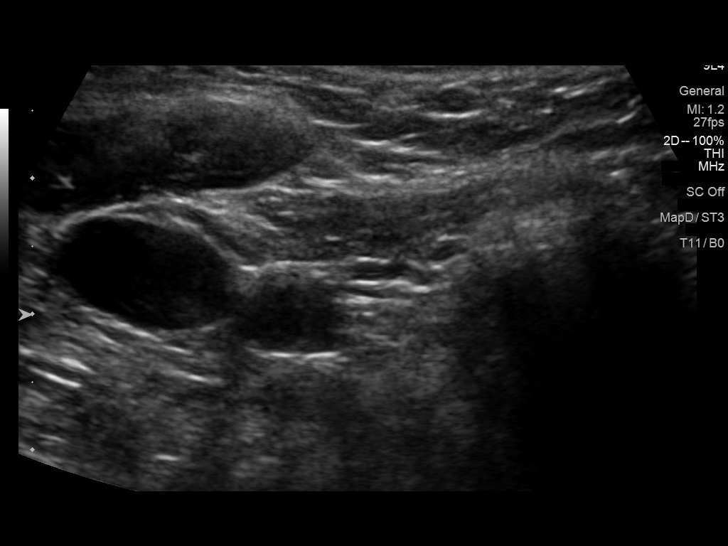
[im 34/38]
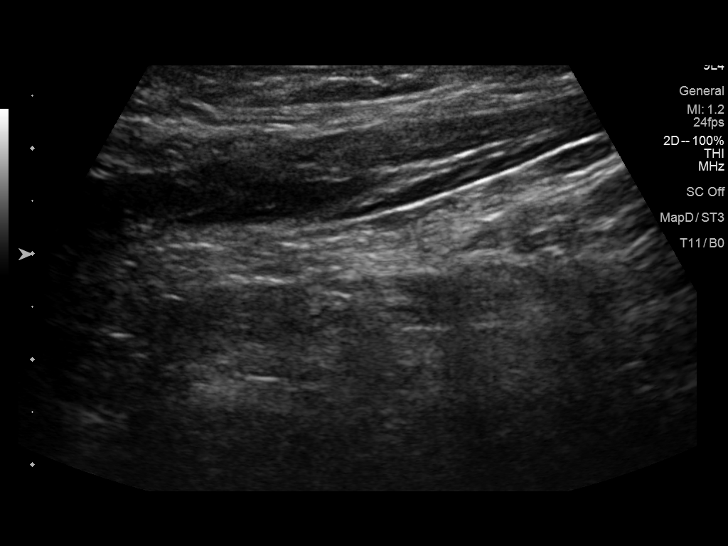
[im 38/38]
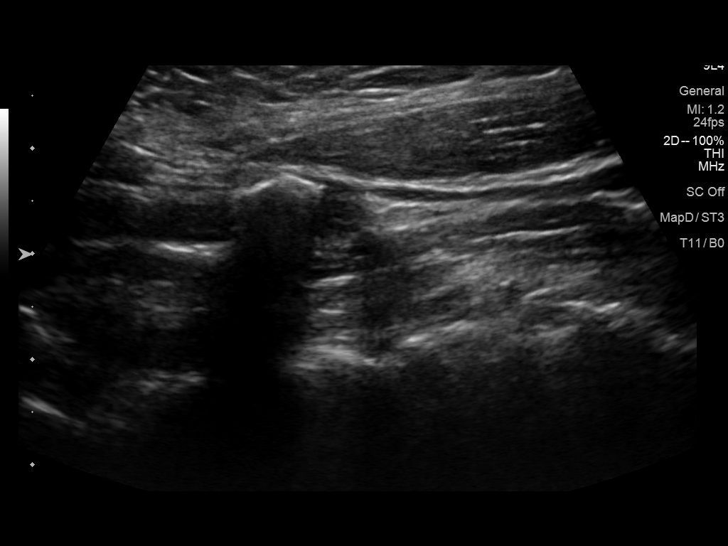

[13 of 25 positions shown; findings below may reference images not displayed]

FINDINGS: Parenchymal Echotexture: Normal

Isthmus: 2.4 mm

Right lobe: 4.2 x 1.4 x 1.9 cm

Left lobe: 4.4 x 1.2 x 1.6 cm

_________________________________________________________

Estimated total number of nodules >/= 1 cm: 1

Number of spongiform nodules >/=  2 cm not described below (TR1): 0

Number of mixed cystic and solid nodules >/= 1.5 cm not described
below (TR2): 0

_________________________________________________________

Nodule # 1:

Location: Right; Mid

Maximum size: 1.0 cm; Other 2 dimensions: 0.9 x 0.8 cm

Composition: solid/almost completely solid (2)

Echogenicity: isoechoic (1)

Shape: not taller-than-wide (0)

Margins: ill-defined (0)

Echogenic foci: peripheral calcifications (2)

ACR TI-RADS total points: 5.

ACR TI-RADS risk category: TR4 (4-6 points).

ACR TI-RADS recommendations:

*Given size (>/= 1 - 1.4 cm) and appearance, a follow-up ultrasound
in 1 year should be considered based on TI-RADS criteria.

_________________________________________________________

There is an additional right inferior thyroid echogenic shadowing
dystrophic calcification measuring 5 mm.

No new or enlarging thyroid nodule.

No hypervascularity.  Negative for adenopathy.
IMPRESSION: Stable 1 cm right mid thyroid TR 4 nodule meets criteria for
continued annual follow-up.

No new finding by ultrasound.

The above is in keeping with the ACR TI-RADS recommendations - [HOSPITAL] 7455;[DATE].

## 2022-07-29 NOTE — Patient Instructions (Signed)
Below is our plan:  We will continue Emgality every 30 days. Continue topiramate 100mg  twice daily for now. You can wean topiramate by 1/2 tablet every week or two if you wish. Continue rizatriptan as needed.   Please make sure you are staying well hydrated. I recommend 50-60 ounces daily. Well balanced diet and regular exercise encouraged. Consistent sleep schedule with 6-8 hours recommended.   Please continue follow up with care team as directed.   Follow up with me in 1 year   You may receive a survey regarding today's visit. I encourage you to leave honest feed back as I do use this information to improve patient care. Thank you for seeing me today!   GENERAL HEADACHE INFORMATION:   Natural supplements: Magnesium Oxide or Magnesium Glycinate 500 mg at bed (up to 800 mg daily) Coenzyme Q10 300 mg in AM Vitamin B2- 200 mg twice a day   Add 1 supplement at a time since even natural supplements can have undesirable side effects. You can sometimes buy supplements cheaper (especially Coenzyme Q10) at www.WebmailGuide.co.za or at [facility].  Migraine with aura: There is increased risk for stroke in women with migraine with aura and a contraindication for the combined contraceptive pill for use by women who have migraine with aura. The risk for women with migraine without aura is lower. However other risk factors like smoking are far more likely to increase stroke risk than migraine. There is a recommendation for no smoking and for the use of OCPs without estrogen such as progestogen only pills particularly for women with migraine with aura.Renee Scott People who have migraine headaches with auras may be 3 times more likely to have a stroke caused by a blood clot, compared to migraine patients who don't see auras. Women who take hormone-replacement therapy may be 30 percent more likely to suffer a clot-based stroke than women not taking medication containing estrogen. Other risk factors like smoking and high blood  pressure may be  much more important.    Vitamins and herbs that show potential:   Magnesium: Magnesium (250 mg twice a day or 500 mg at bed) has a relaxant effect on smooth muscles such as blood vessels. Individuals suffering from frequent or daily headache usually have low magnesium levels which can be increase with daily supplementation of 400-750 mg. Three trials found 40-90% average headache reduction  when used as a preventative. Magnesium may help with headaches are aura, the best evidence for magnesium is for migraine with aura is its thought to stop the cortical spreading depression we believe is the pathophysiology of migraine aura.Magnesium also demonstrated the benefit in menstrually related migraine.  Magnesium is part of the messenger system in the serotonin cascade and it is a good muscle relaxant.  It is also useful for constipation which can be a side effect of other medications used to treat migraine. Good sources include nuts, whole grains, and tomatoes. Side Effects: loose stool/diarrhea  Riboflavin (vitamin B 2) 200 mg twice a day. This vitamin assists nerve cells in the production of ATP a principal energy storing molecule.  It is necessary for many chemical reactions in the body.  There have been at least 3 clinical trials of riboflavin using 400 mg per day all of which suggested that migraine frequency can be decreased.  All 3 trials showed significant improvement in over half of migraine sufferers.  The supplement is found in bread, cereal, milk, meat, and poultry.  Most Americans get more riboflavin than the recommended  daily allowance, however riboflavin deficiency is not necessary for the supplements to help prevent headache. Side effects: energizing, green urine   Coenzyme Q10: This is present in almost all cells in the body and is critical component for the conversion of energy.  Recent studies have shown that a nutritional supplement of CoQ10 can reduce the frequency of  migraine attacks by improving the energy production of cells as with riboflavin.  Doses of 150 mg twice a day have been shown to be effective.   Melatonin: Increasing evidence shows correlation between melatonin secretion and headache conditions.  Melatonin supplementation has decreased headache intensity and duration.  It is widely used as a sleep aid.  Sleep is natures way of dealing with migraine.  A dose of 3 mg is recommended to start for headaches including cluster headache. Higher doses up to 15 mg has been reviewed for use in Cluster headache and have been used. The rationale behind using melatonin for cluster is that many theories regarding the cause of Cluster headache center around the disruption of the normal circadian rhythm in the brain.  This helps restore the normal circadian rhythm.   HEADACHE DIET: Foods and beverages which may trigger migraine Note that only 20% of headache patients are food sensitive. You will know if you are food sensitive if you get a headache consistently 20 minutes to 2 hours after eating a certain food. Only cut out a food if it causes headaches, otherwise you might remove foods you enjoy! What matters most for diet is to eat a well balanced healthy diet full of vegetables and low fat protein, and to not miss meals.   Chocolate, other sweets ALL cheeses except cottage and cream cheese Dairy products, yogurt, sour cream, ice cream Liver Meat extracts (Bovril, Marmite, meat tenderizers) Meats or fish which have undergone aging, fermenting, pickling or smoking. These include: Hotdogs,salami,Lox,sausage, mortadellas,smoked salmon, pepperoni, Pickled herring Pods of broad bean (English beans, Chinese pea pods, [place] (fava) beans, lima and navy beans Ripe avocado, ripe banana Yeast extracts or active yeast preparations such as Brewer's or Fleishman's (commercial bakes goods are permitted) Tomato based foods, pizza (lasagna, etc.)   MSG (monosodium glutamate)  is disguised as many things; look for these common aliases: Monopotassium glutamate Autolysed yeast Hydrolysed protein Sodium caseinate "flavorings" "all natural preservatives" Nutrasweet   Avoid all other foods that convincingly provoke headaches.   Resources: The Dizzy [place]air Laundry Your Headache Diet, migrainestrong.com  https://zamora-andrews.com/   Caffeine and Migraine For patients that have migraine, caffeine intake more than 3 days per week can lead to dependency and increased migraine frequency. I would recommend cutting back on your caffeine intake as best you can. The recommended amount of caffeine is 200-300 mg daily, although migraine patients may experience dependency at even lower doses. While you may notice an increase in headache temporarily, cutting back will be helpful for headaches in the long run. For more information on caffeine and migraine, visit: https://americanmigrainefoundation.org/resource-library/caffeine-and-migraine/   Headache Prevention Strategies:   1. Maintain a headache diary; learn to identify and avoid triggers.  - This can be a simple note where you log when you had a headache, associated symptoms, and medications used - There are several smartphone apps developed to help track migraines: Migraine Buddy, Migraine Monitor, Curelator N1-Headache App   Common triggers include: Emotional triggers: Emotional/Upset family or friends Emotional/Upset occupation Business reversal/success Anticipation anxiety Crisis-serious Post-crisis periodNew job/position   Physical triggers: Vacation Day Weekend Strenuous Exercise High Altitude Location New Move  Menstrual Day Physical Illness Oversleep/Not enough sleep Weather changes Light: Photophobia or light sesnitivity treatment involves a balance between desensitization and reduction in overly strong input. Use dark polarized glasses outside, but not inside.  Avoid bright or fluorescent light, but do not dim environment to the point that going into a normally lit room hurts. Consider FL-41 tint lenses, which reduce the most irritating wavelengths without blocking too much light.  These can be obtained at axonoptics.com or theraspecs.com Foods: see list above.   2. Limit use of acute treatments (over-the-counter medications, triptans, etc.) to no more than 2 days per week or 10 days per month to prevent medication overuse headache (rebound headache).     3. Follow a regular schedule (including weekends and holidays): Don't skip meals. Eat a balanced diet. 8 hours of sleep nightly. Minimize stress. Exercise 30 minutes per day. Being overweight is associated with a 5 times increased risk of chronic migraine. Keep well hydrated and drink 6-8 glasses of water per day.   4. Initiate non-pharmacologic measures at the earliest onset of your headache. Rest and quiet environment. Relax and reduce stress. Breathe2Relax is a free app that can instruct you on    some simple relaxtion and breathing techniques. Http://Dawnbuse.com is a    free website that provides teaching videos on relaxation.  Also, there are  many apps that   can be downloaded for "mindful" relaxation.  An app called YOGA NIDRA will help walk you through mindfulness. Another app called Calm can be downloaded to give you a structured mindfulness guide with daily reminders and skill development. Headspace for guided meditation Mindfulness Based Stress Reduction Online Course: www.palousemindfulness.com Cold compresses.   5. Don't wait!! Take the maximum allowable dosage of prescribed medication at the first sign of migraine.   6. Compliance:  Take prescribed medication regularly as directed and at the first sign of a migraine.   7. Communicate:  Call your physician when problems arise, especially if your headaches change, increase in frequency/severity, or become associated with neurological  symptoms (weakness, numbness, slurred speech, etc.). Proceed to emergency room if you experience new or worsening symptoms or symptoms do not resolve, if you have new neurologic symptoms or if headache is severe, or for any concerning symptom.   8. Headache/pain management therapies: Consider various complementary methods, including medication, behavioral therapy, psychological counselling, biofeedback, massage therapy, acupuncture, dry needling, and other modalities.  Such measures may reduce the need for medications. Counseling for pain management, where patients learn to function and ignore/minimize their pain, seems to work very well.   9. Recommend changing family's attention and focus away from patient's headaches. Instead, emphasize daily activities. If first question of day is 'How are your headaches/Do you have a headache today?', then patient will constantly think about headaches, thus making them worse. Goal is to re-direct attention away from headaches, toward daily activities and other distractions.   10. Helpful Websites: www.AmericanHeadacheSociety.org PatentHood.ch www.headaches.org TightMarket.nl www.achenet.org

## 2022-07-29 NOTE — Progress Notes (Unsigned)
No chief complaint on file.   HISTORY OF PRESENT ILLNESS:  07/29/22 ALL:  Renee Scott returns for follow up for headaches. She continues Emgality, topiramate 100mg  BID and rizatriptan as needed.   07/29/2021 ALL: Renee Scott returns for follow up for headaches. She continues Emgality, topiramate 100mg  BID and rizatriptan. She is doing very well. She may have 2-3 headache days a month. She has only used rizatriptan 3-4 times over the past year. She is usually able to abort headache with Motrin. Fluorescent lights are main trigger. She is followed closely by PCP.   09/10/2020 ALL:  Renee Scott returns for headache follow up. We continued topiramate 100mg  BID and added Emgality at last visit 05/2020. Rizatriptan started for abortive therapy. She reports significant improvement in migraines. She is tolerating meds well. She has used 12 rizatriptan tablets in the last three months. It works well.   06/11/2020 ALL:  Renee Scott is a 62 y.o. female here today for follow up for left sided headaches. MRI was unremarkable. Topiramate was increased to 100mg  BID. Since, headaches are less frequent. She reports that headaches are fairly severe when she does get them. She has left sided pounding, throbbing headaches. Occurring daily. Can last minutes to hours. She has light and sound sensitivity. She describes having wavy vision in left eye prior to headache. She has tried Aleve and Motrin with minimal relief. She has cut back and no longer taking daily. She does not smoke.   HISTORY (copied from Dr Richrd Humbles previous note)  62 year old female here for evaluation of headaches.   Patient has complex surgical history including C4-C6 ACDF in 2019, cervical nerve root ablation in July 2021, history of carpal tunnel release surgery, right knee meniscus tear surgery, rotator cuff repair.    Following cervical ablation surgery in July 2021 patient experienced increasing pain in the left posterior neck, left  occipital region.  In September 2021 patient had increasing left-sided headaches, left retro-orbital pain, photophobia, blurred vision, scalp sensitivity.  Patient went to ophthalmology clinic was evaluated for temporal arteritis or other primary ocular pathologies which were ruled out.   No prior history of migraine.  Has family history of migraine in a cousin.  Headaches last 20 to 25 minutes at a time, multiple times a day.  She takes Aleve and Motrin multiple times on a daily basis.  No other specific triggering or aggravating factors.   REVIEW OF SYSTEMS: Out of a complete 14 system review of symptoms, the patient complains only of the following symptoms, headaches and all other reviewed systems are negative.   ALLERGIES: Allergies  Allergen Reactions   Ketamine Other (See Comments)    "I hallucinated terribly"     HOME MEDICATIONS: Outpatient Medications Prior to Visit  Medication Sig Dispense Refill   calcium carbonate (TUMS EX) 750 MG chewable tablet Chew by mouth.     Cholecalciferol (VITAMIN D3 PO) Take 5,000 Units by mouth daily.     Galcanezumab-gnlm (EMGALITY) 120 MG/ML SOAJ Inject 120 mg into the skin every 30 (thirty) days. 3 mL 3   ibuprofen (ADVIL) 800 MG tablet Take 800 mg by mouth every 8 (eight) hours as needed.     rizatriptan (MAXALT-MLT) 10 MG disintegrating tablet Take 1 tablet (10 mg total) by mouth as needed for migraine. May repeat in 2 hours if needed 9 tablet 11   rosuvastatin (CRESTOR) 10 MG tablet Take 10 mg by mouth at bedtime.     topiramate (TOPAMAX) 100 MG tablet Take 1  tablet (100 mg total) by mouth 2 (two) times daily. 180 tablet 2   valsartan-hydrochlorothiazide (DIOVAN-HCT) 160-25 MG tablet Take 1 tablet by mouth daily.     No facility-administered medications prior to visit.     PAST MEDICAL HISTORY: Past Medical History:  Diagnosis Date   Carpal tunnel syndrome    Cervical arthritis    Hyperlipidemia    Hypertension    Medial meniscus  tear    right   Radiculopathy    Spondylosis of cervical spine    Thyroid nodule      PAST SURGICAL HISTORY: Past Surgical History:  Procedure Laterality Date   ABDOMINAL HYSTERECTOMY  06/1996   ANTERIOR CERVICAL DECOMP/DISCECTOMY FUSION  04/2017   CARPAL TUNNEL RELEASE Right 2013   CERVICAL ABLATION Left 07/2019   left neck cervical ablation  06/2019   MENISCUS REPAIR Right 2018   ROTATOR CUFF REPAIR Left 2019     FAMILY HISTORY: Family History  Problem Relation Age of Onset   Hypertension Father    High Cholesterol Father    Hypertension Paternal Grandmother    High Cholesterol Paternal Grandmother    Thyroid disease Paternal Grandmother    Diabetes Paternal Grandmother    Breast cancer Daughter 39       rare breast ca found. genetic testing. connection with paternal pancreatic ca.     SOCIAL HISTORY: Social History   Socioeconomic History   Marital status: Widowed    Spouse name: Not on file   Number of children: 3   Years of education: Not on file   Highest education level: Associate degree: academic program  Occupational History    Comment: RN  Tobacco Use   Smoking status: Former    Types: Cigarettes    Quit date: 06/09/2016    Years since quitting: 6.1   Smokeless tobacco: Never  Substance and Sexual Activity   Alcohol use: Not Currently   Drug use: Never   Sexual activity: Not on file  Other Topics Concern   Not on file  Social History Narrative   Lives alone   Caffeine- 3-5 c daily   Social Determinants of Health   Financial Resource Strain: Not on file  Food Insecurity: Not on file  Transportation Needs: Not on file  Physical Activity: Not on file  Stress: Not on file  Social Connections: Not on file  Intimate Partner Violence: Not on file      PHYSICAL EXAM  There were no vitals filed for this visit.    There is no height or weight on file to calculate BMI.   Generalized: Well developed, in no acute distress  Cardiology:  normal rate and rhythm, no murmur auscultated  Respiratory: clear to auscultation bilaterally    Neurological examination  Mentation: Alert oriented to time, place, history taking. Follows all commands speech and language fluent Cranial nerve II-XII: Pupils were equal round reactive to light. Extraocular movements were full, visual field were full on confrontational test. Facial sensation and strength were normal. Head turning and shoulder shrug  were normal and symmetric. Motor: The motor testing reveals 5 over 5 strength of all 4 extremities. Good symmetric motor tone is noted throughout.  Gait and station: Gait is normal.     DIAGNOSTIC DATA (LABS, IMAGING, TESTING) - I reviewed patient records, labs, notes, testing and imaging myself where available.  No results found for: "WBC", "HGB", "HCT", "MCV", "PLT" No results found for: "NA", "K", "CL", "CO2", "GLUCOSE", "BUN", "CREATININE", "CALCIUM", "PROT", "ALBUMIN", "AST", "  ALT", "ALKPHOS", "BILITOT", "GFRNONAA", "GFRAA" No results found for: "CHOL", "HDL", "LDLCALC", "LDLDIRECT", "TRIG", "CHOLHDL" No results found for: "HGBA1C" No results found for: "VITAMINB12" No results found for: "TSH"      No data to display               No data to display           ASSESSMENT AND PLAN  62 y.o. year old female  has a past medical history of Carpal tunnel syndrome, Cervical arthritis, Hyperlipidemia, Hypertension, Medial meniscus tear, Radiculopathy, Spondylosis of cervical spine, and Thyroid nodule. here with    No diagnosis found.  She is doing much better. Migraines are less frequent and less severe. We will continue topiramate 100mg  twice daily, Emgality every 30 days and rizatriptan as needed. She will continue healthy lifestyle habits. Continue follow up with spine specialist and PCP. Follow up with me in 1 year.    No orders of the defined types were placed in this encounter.     No orders of the defined types were  placed in this encounter.     Shawnie Dapper, MSN, FNP-C 07/29/2022, 4:05 PM  [facility] [address] [place], [place] [address] [phone]

## 2022-07-30 ENCOUNTER — Encounter: Payer: Self-pay | Admitting: Family Medicine

## 2022-07-30 ENCOUNTER — Ambulatory Visit: Payer: PRIVATE HEALTH INSURANCE | Admitting: Family Medicine

## 2022-07-30 VITALS — BP 92/59 | HR 71 | Ht 62.4 in | Wt 161.0 lb

## 2022-07-30 DIAGNOSIS — G43E09 Chronic migraine with aura, not intractable, without status migrainosus: Secondary | ICD-10-CM | POA: Diagnosis not present

## 2022-07-30 MED ORDER — EMGALITY 120 MG/ML ~~LOC~~ SOAJ: 120.0000 mg | SUBCUTANEOUS | 3 refills | Status: DC

## 2022-07-30 MED ORDER — RIZATRIPTAN BENZOATE 10 MG PO TBDP: 10.0000 mg | ORAL_TABLET | ORAL | 11 refills | Status: DC | PRN

## 2022-07-30 MED ORDER — TOPIRAMATE 100 MG PO TABS: 100.0000 mg | ORAL_TABLET | Freq: Two times a day (BID) | ORAL | 3 refills | Status: DC

## 2023-06-21 ENCOUNTER — Other Ambulatory Visit: Payer: Self-pay | Admitting: Nurse Practitioner

## 2023-06-21 DIAGNOSIS — E041 Nontoxic single thyroid nodule: Secondary | ICD-10-CM

## 2023-07-21 ENCOUNTER — Ambulatory Visit
Admission: RE | Admit: 2023-07-21 | Discharge: 2023-07-21 | Disposition: A | Discharge status: Home / Self Care | Payer: PRIVATE HEALTH INSURANCE | Source: Ambulatory Visit | Attending: Nurse Practitioner | Admitting: Nurse Practitioner

## 2023-07-21 DIAGNOSIS — E041 Nontoxic single thyroid nodule: Secondary | ICD-10-CM

## 2023-08-02 ENCOUNTER — Encounter: Payer: Self-pay | Admitting: Family Medicine

## 2023-08-02 ENCOUNTER — Ambulatory Visit: Payer: PRIVATE HEALTH INSURANCE | Admitting: Family Medicine

## 2023-08-02 VITALS — BP 108/66 | HR 78 | Ht 62.0 in | Wt 133.5 lb

## 2023-08-02 DIAGNOSIS — G43E09 Chronic migraine with aura, not intractable, without status migrainosus: Secondary | ICD-10-CM

## 2023-08-02 MED ORDER — RIZATRIPTAN BENZOATE 10 MG PO TBDP: 10.0000 mg | ORAL_TABLET | ORAL | 11 refills | Status: AC | PRN

## 2023-08-02 MED ORDER — EMGALITY 120 MG/ML ~~LOC~~ SOAJ: 120.0000 mg | SUBCUTANEOUS | 3 refills | Status: AC

## 2023-08-02 MED ORDER — TOPIRAMATE 100 MG PO TABS: 100.0000 mg | ORAL_TABLET | Freq: Every day | ORAL | 3 refills | Status: AC

## 2023-08-02 NOTE — Patient Instructions (Signed)
 Below is our plan:  We will continue Emgality  monthly, topiramate  100mg  daily and rizatriptan  as needed.   Please make sure you are staying well hydrated. I recommend 50-60 ounces daily. Well balanced diet and regular exercise encouraged. Consistent sleep schedule with 6-8 hours recommended.   Please continue follow up with care team as directed.   Follow up with me in 1 year   You may receive a survey regarding today's visit. I encourage you to leave honest feed back as I do use this information to improve patient care. Thank you for seeing me today!   GENERAL HEADACHE INFORMATION:   Natural supplements: Magnesium Oxide or Magnesium Glycinate 500 mg at bed (up to 800 mg daily) Coenzyme Q10 300 mg in AM Vitamin B2- 200 mg twice a day   Add 1 supplement at a time since even natural supplements can have undesirable side effects. You can sometimes buy supplements cheaper (especially Coenzyme Q10) at www.WebmailGuide.co.za or at [facility].  Migraine with aura: There is increased risk for stroke in women with migraine with aura and a contraindication for the combined contraceptive pill for use by women who have migraine with aura. The risk for women with migraine without aura is lower. However other risk factors like smoking are far more likely to increase stroke risk than migraine. There is a recommendation for no smoking and for the use of OCPs without estrogen such as progestogen only pills particularly for women with migraine with aura.SABRA People who have migraine headaches with auras may be 3 times more likely to have a stroke caused by a blood clot, compared to migraine patients who don't see auras. Women who take hormone-replacement therapy may be 30 percent more likely to suffer a clot-based stroke than women not taking medication containing estrogen. Other risk factors like smoking and high blood pressure may be  much more important.    Vitamins and herbs that show potential:   Magnesium: Magnesium  (250 mg twice a day or 500 mg at bed) has a relaxant effect on smooth muscles such as blood vessels. Individuals suffering from frequent or daily headache usually have low magnesium levels which can be increase with daily supplementation of 400-750 mg. Three trials found 40-90% average headache reduction  when used as a preventative. Magnesium may help with headaches are aura, the best evidence for magnesium is for migraine with aura is its thought to stop the cortical spreading depression we believe is the pathophysiology of migraine aura.Magnesium also demonstrated the benefit in menstrually related migraine.  Magnesium is part of the messenger system in the serotonin cascade and it is a good muscle relaxant.  It is also useful for constipation which can be a side effect of other medications used to treat migraine. Good sources include nuts, whole grains, and tomatoes. Side Effects: loose stool/diarrhea  Riboflavin (vitamin B 2) 200 mg twice a day. This vitamin assists nerve cells in the production of ATP a principal energy storing molecule.  It is necessary for many chemical reactions in the body.  There have been at least 3 clinical trials of riboflavin using 400 mg per day all of which suggested that migraine frequency can be decreased.  All 3 trials showed significant improvement in over half of migraine sufferers.  The supplement is found in bread, cereal, milk, meat, and poultry.  Most Americans get more riboflavin than the recommended daily allowance, however riboflavin deficiency is not necessary for the supplements to help prevent headache. Side effects: energizing, green urine  Coenzyme Q10: This is present in almost all cells in the body and is critical component for the conversion of energy.  Recent studies have shown that a nutritional supplement of CoQ10 can reduce the frequency of migraine attacks by improving the energy production of cells as with riboflavin.  Doses of 150 mg twice a day have  been shown to be effective.   Melatonin: Increasing evidence shows correlation between melatonin secretion and headache conditions.  Melatonin supplementation has decreased headache intensity and duration.  It is widely used as a sleep aid.  Sleep is natures way of dealing with migraine.  A dose of 3 mg is recommended to start for headaches including cluster headache. Higher doses up to 15 mg has been reviewed for use in Cluster headache and have been used. The rationale behind using melatonin for cluster is that many theories regarding the cause of Cluster headache center around the disruption of the normal circadian rhythm in the brain.  This helps restore the normal circadian rhythm.   HEADACHE DIET: Foods and beverages which may trigger migraine Note that only 20% of headache patients are food sensitive. You will know if you are food sensitive if you get a headache consistently 20 minutes to 2 hours after eating a certain food. Only cut out a food if it causes headaches, otherwise you might remove foods you enjoy! What matters most for diet is to eat a well balanced healthy diet full of vegetables and low fat protein, and to not miss meals.   Chocolate, other sweets ALL cheeses except cottage and cream cheese Dairy products, yogurt, sour cream, ice cream Liver Meat extracts (Bovril, Marmite, meat tenderizers) Meats or fish which have undergone aging, fermenting, pickling or smoking. These include: Hotdogs,salami,Lox,sausage, mortadellas,smoked salmon, pepperoni, Pickled herring Pods of broad bean (English beans, Chinese pea pods, [place] (fava) beans, lima and navy beans Ripe avocado, ripe banana Yeast extracts or active yeast preparations such as Brewer's or Fleishman's (commercial bakes goods are permitted) Tomato based foods, pizza (lasagna, etc.)   MSG (monosodium glutamate) is disguised as many things; look for these common aliases: Monopotassium glutamate Autolysed yeast Hydrolysed  protein Sodium caseinate "flavorings" "all natural preservatives Nutrasweet   Avoid all other foods that convincingly provoke headaches.   Resources: The Dizzy Bluford Aid Your Headache Diet, migrainestrong.com  https://zamora-andrews.com/   Caffeine and Migraine For patients that have migraine, caffeine intake more than 3 days per week can lead to dependency and increased migraine frequency. I would recommend cutting back on your caffeine intake as best you can. The recommended amount of caffeine is 200-300 mg daily, although migraine patients may experience dependency at even lower doses. While you may notice an increase in headache temporarily, cutting back will be helpful for headaches in the long run. For more information on caffeine and migraine, visit: https://americanmigrainefoundation.org/resource-library/caffeine-and-migraine/   Headache Prevention Strategies:   1. Maintain a headache diary; learn to identify and avoid triggers.  - This can be a simple note where you log when you had a headache, associated symptoms, and medications used - There are several smartphone apps developed to help track migraines: Migraine Buddy, Migraine Monitor, Curelator N1-Headache App   Common triggers include: Emotional triggers: Emotional/Upset family or friends Emotional/Upset occupation Business reversal/success Anticipation anxiety Crisis-serious Post-crisis periodNew job/position   Physical triggers: Vacation Day Weekend Strenuous Exercise High Altitude Location New Move Menstrual Day Physical Illness Oversleep/Not enough sleep Weather changes Light: Photophobia or light sesnitivity treatment involves a balance between desensitization and reduction  in overly strong input. Use dark polarized glasses outside, but not inside. Avoid bright or fluorescent light, but do not dim environment to the point that going into a normally lit room  hurts. Consider FL-41 tint lenses, which reduce the most irritating wavelengths without blocking too much light.  These can be obtained at axonoptics.com or theraspecs.com Foods: see list above.   2. Limit use of acute treatments (over-the-counter medications, triptans, etc.) to no more than 2 days per week or 10 days per month to prevent medication overuse headache (rebound headache).     3. Follow a regular schedule (including weekends and holidays): Don't skip meals. Eat a balanced diet. 8 hours of sleep nightly. Minimize stress. Exercise 30 minutes per day. Being overweight is associated with a 5 times increased risk of chronic migraine. Keep well hydrated and drink 6-8 glasses of water per day.   4. Initiate non-pharmacologic measures at the earliest onset of your headache. Rest and quiet environment. Relax and reduce stress. Breathe2Relax is a free app that can instruct you on    some simple relaxtion and breathing techniques. Http://Dawnbuse.com is a    free website that provides teaching videos on relaxation.  Also, there are  many apps that   can be downloaded for "mindful" relaxation.  An app called YOGA NIDRA will help walk you through mindfulness. Another app called Calm can be downloaded to give you a structured mindfulness guide with daily reminders and skill development. Headspace for guided meditation Mindfulness Based Stress Reduction Online Course: www.palousemindfulness.com Cold compresses.   5. Don't wait!! Take the maximum allowable dosage of prescribed medication at the first sign of migraine.   6. Compliance:  Take prescribed medication regularly as directed and at the first sign of a migraine.   7. Communicate:  Call your physician when problems arise, especially if your headaches change, increase in frequency/severity, or become associated with neurological symptoms (weakness, numbness, slurred speech, etc.). Proceed to emergency room if you experience new or worsening  symptoms or symptoms do not resolve, if you have new neurologic symptoms or if headache is severe, or for any concerning symptom.   8. Headache/pain management therapies: Consider various complementary methods, including medication, behavioral therapy, psychological counselling, biofeedback, massage therapy, acupuncture, dry needling, and other modalities.  Such measures may reduce the need for medications. Counseling for pain management, where patients learn to function and ignore/minimize their pain, seems to work very well.   9. Recommend changing family's attention and focus away from patient's headaches. Instead, emphasize daily activities. If first question of day is 'How are your headaches/Do you have a headache today?', then patient will constantly think about headaches, thus making them worse. Goal is to re-direct attention away from headaches, toward daily activities and other distractions.   10. Helpful Websites: www.AmericanHeadacheSociety.org PatentHood.ch www.headaches.org TightMarket.nl www.achenet.org

## 2023-08-02 NOTE — Progress Notes (Signed)
 Chief Complaint  Patient presents with   RM1/MIGRAINES    Pt is here Alone. Pt states that her migraines have been about the same since her last appointment. Pt states that her migraines are more controlled.     HISTORY OF PRESENT ILLNESS:  08/02/23 ALL:  Renee Scott returns for follow up for migraines. She reports headaches are well managed. She may have 4-5 migraines a month. Recently lost her sister in law to cancer and has had more stress. She continues topiramate  100mg  daily (weaned about 4 months ago), Emgality  monthly and rizatriptan  as needed. She continues to work full time at [employer]. She is followed by PCP twice a year.   07/30/2022 ALL:  Renee Scott returns for follow up for headaches. She continues Emgality , topiramate  100mg  BID and rizatriptan  as needed. She continues to do well. Rare migraines. She has taken rizatriptan  a couple times over the past year. BP is usually 110-120s. She is followed regularly by PCP. She is feeling well and without concerns, today.  07/29/2021 ALL: Renee Scott returns for follow up for headaches. She continues Emgality , topiramate  100mg  BID and rizatriptan . She is doing very well. She may have 2-3 headache days a month. She has only used rizatriptan  3-4 times over the past year. She is usually able to abort headache with Motrin. Fluorescent lights are main trigger. She is followed closely by PCP.   09/10/2020 ALL:  Renee Scott returns for headache follow up. We continued topiramate  100mg  BID and added Emgality  at last visit 05/2020. Rizatriptan  started for abortive therapy. She reports significant improvement in migraines. She is tolerating meds well. She has used 12 rizatriptan  tablets in the last three months. It works well.   06/11/2020 ALL:  Renee Scott is a 63 y.o. female here today for follow up for left sided headaches. MRI was unremarkable. Topiramate  was increased to 100mg  BID. Since, headaches are less frequent. She reports that headaches are  fairly severe when she does get them. She has left sided pounding, throbbing headaches. Occurring daily. Can last minutes to hours. She has light and sound sensitivity. She describes having wavy vision in left eye prior to headache. She has tried Aleve and Motrin with minimal relief. She has cut back and no longer taking daily. She does not smoke.   HISTORY (copied from Dr Chancy previous note)  63 year old female here for evaluation of headaches.   Patient has complex surgical history including C4-C6 ACDF in 2019, cervical nerve root ablation in July 2021, history of carpal tunnel release surgery, right knee meniscus tear surgery, rotator cuff repair.    Following cervical ablation surgery in July 2021 patient experienced increasing pain in the left posterior neck, left occipital region.  In September 2021 patient had increasing left-sided headaches, left retro-orbital pain, photophobia, blurred vision, scalp sensitivity.  Patient went to ophthalmology clinic was evaluated for temporal arteritis or other primary ocular pathologies which were ruled out.   No prior history of migraine.  Has family history of migraine in a cousin.  Headaches last 20 to 25 minutes at a time, multiple times a day.  She takes Aleve and Motrin multiple times on a daily basis.  No other specific triggering or aggravating factors.   REVIEW OF SYSTEMS: Out of a complete 14 system review of symptoms, the patient complains only of the following symptoms, headaches, neck pain and all other reviewed systems are negative.   ALLERGIES: Allergies  Allergen Reactions   Ketamine Other (See Comments)  I hallucinated terribly     HOME MEDICATIONS: Outpatient Medications Prior to Visit  Medication Sig Dispense Refill   calcium carbonate (TUMS EX) 750 MG chewable tablet Chew by mouth.     Cholecalciferol (VITAMIN D3 PO) Take 5,000 Units by mouth daily.     ibuprofen (ADVIL) 800 MG tablet Take 800 mg by mouth every 8  (eight) hours as needed.     rosuvastatin (CRESTOR) 10 MG tablet Take 10 mg by mouth at bedtime.     valsartan-hydrochlorothiazide (DIOVAN-HCT) 160-25 MG tablet Take 1 tablet by mouth daily.     Galcanezumab -gnlm (EMGALITY ) 120 MG/ML SOAJ Inject 120 mg into the skin every 30 (thirty) days. 3 mL 3   rizatriptan  (MAXALT -MLT) 10 MG disintegrating tablet Take 1 tablet (10 mg total) by mouth as needed for migraine. May repeat in 2 hours if needed 9 tablet 11   topiramate  (TOPAMAX ) 100 MG tablet Take 1 tablet (100 mg total) by mouth 2 (two) times daily. 180 tablet 3   No facility-administered medications prior to visit.     PAST MEDICAL HISTORY: Past Medical History:  Diagnosis Date   Carpal tunnel syndrome    Cervical arthritis    Hyperlipidemia    Hypertension    Medial meniscus tear    right   Radiculopathy    Spondylosis of cervical spine    Thyroid  nodule      PAST SURGICAL HISTORY: Past Surgical History:  Procedure Laterality Date   ABDOMINAL HYSTERECTOMY  06/1996   ANTERIOR CERVICAL DECOMP/DISCECTOMY FUSION  04/2017   CARPAL TUNNEL RELEASE Right 2013   CERVICAL ABLATION Left 07/2019   left neck cervical ablation  06/2019   MENISCUS REPAIR Right 2018   ROTATOR CUFF REPAIR Left 2019     FAMILY HISTORY: Family History  Problem Relation Age of Onset   Hypertension Father    High Cholesterol Father    Hypertension Paternal Grandmother    High Cholesterol Paternal Grandmother    Thyroid  disease Paternal Grandmother    Diabetes Paternal Grandmother    Breast cancer Daughter 24       rare breast ca found. genetic testing. connection with paternal pancreatic ca.     SOCIAL HISTORY: Social History   Socioeconomic History   Marital status: Widowed    Spouse name: Not on file   Number of children: 3   Years of education: Not on file   Highest education level: Associate degree: academic program  Occupational History    Comment: RN  Tobacco Use   Smoking status:  Former    Current packs/day: 0.00    Types: Cigarettes    Quit date: 06/09/2016    Years since quitting: 7.1   Smokeless tobacco: Never  Substance and Sexual Activity   Alcohol use: Not Currently   Drug use: Never   Sexual activity: Not on file  Other Topics Concern   Not on file  Social History Narrative   Lives alone   Caffeine- 3-5 c daily   Social Drivers of Health   Financial Resource Strain: Not on file  Food Insecurity: Not on file  Transportation Needs: Not on file  Physical Activity: Not on file  Stress: Not on file  Social Connections: Not on file  Intimate Partner Violence: Not on file      PHYSICAL EXAM  Vitals:   08/02/23 1442  BP: 108/66  Pulse: 78  SpO2: 99%  Weight: 133 lb 8 oz (60.6 kg)  Height: 5' 2 (1.575 m)  Body mass index is 24.42 kg/m.   Generalized: Well developed, in no acute distress  Cardiology: normal rate and rhythm, no murmur auscultated  Respiratory: clear to auscultation bilaterally    Neurological examination  Mentation: Alert oriented to time, place, history taking. Follows all commands speech and language fluent Cranial nerve II-XII: Pupils were equal round reactive to light. Extraocular movements were full, visual field were full on confrontational test. Facial sensation and strength were normal. Head turning and shoulder shrug  were normal and symmetric. Motor: The motor testing reveals 5 over 5 strength of all 4 extremities. Good symmetric motor tone is noted throughout.  Gait and station: Gait is normal.     DIAGNOSTIC DATA (LABS, IMAGING, TESTING) - I reviewed patient records, labs, notes, testing and imaging myself where available.  No results found for: WBC, HGB, HCT, MCV, PLT No results found for: NA, K, CL, CO2, GLUCOSE, BUN, CREATININE, CALCIUM, PROT, ALBUMIN, AST, ALT, ALKPHOS, BILITOT, GFRNONAA, GFRAA No results found for: CHOL, HDL, LDLCALC,  LDLDIRECT, TRIG, CHOLHDL No results found for: YHAJ8R No results found for: VITAMINB12 No results found for: TSH      No data to display               No data to display           ASSESSMENT AND PLAN  63 y.o. year old female  has a past medical history of Carpal tunnel syndrome, Cervical arthritis, Hyperlipidemia, Hypertension, Medial meniscus tear, Radiculopathy, Spondylosis of cervical spine, and Thyroid  nodule. here with    Chronic migraine with aura without status migrainosus, not intractable  Chronic migraine with aura - Plan: rizatriptan  (MAXALT -MLT) 10 MG disintegrating tablet, Galcanezumab -gnlm (EMGALITY ) 120 MG/ML SOAJ  She is doing much better. Migraines are less frequent and less severe. We will continue topiramate  100mg  daily, Emgality  every 30 days and rizatriptan  as needed. She will continue healthy lifestyle habits. Continue follow up with spine specialist and PCP. Follow up with me in 1 year.    No orders of the defined types were placed in this encounter.     Meds ordered this encounter  Medications   rizatriptan  (MAXALT -MLT) 10 MG disintegrating tablet    Sig: Take 1 tablet (10 mg total) by mouth as needed for migraine. May repeat in 2 hours if needed    Dispense:  9 tablet    Refill:  11    Supervising Provider:   AHERN, ANTONIA B [[id number]]   topiramate  (TOPAMAX ) 100 MG tablet    Sig: Take 1 tablet (100 mg total) by mouth daily.    Dispense:  90 tablet    Refill:  3    Supervising Provider:   AHERN, ANTONIA B [[id number]]   Galcanezumab -gnlm (EMGALITY ) 120 MG/ML SOAJ    Sig: Inject 120 mg into the skin every 30 (thirty) days.    Dispense:  3 mL    Refill:  3    Supervising Provider:   INES ONETHA NOVAK [[id number]]      Greig Forbes, MSN, FNP-C 08/02/2023, 3:03 PM  [facility] [address] [place], [place] [address] [phone]

## 2024-08-07 ENCOUNTER — Ambulatory Visit: Payer: PRIVATE HEALTH INSURANCE | Admitting: Family Medicine
